# Patient Record
Sex: Female | Born: 1950
Health system: Southern US, Community
[De-identification: ages and names within clinical notes are randomized; demographics above are authoritative.]

## PROBLEM LIST (undated history)

## (undated) DIAGNOSIS — E663 Overweight: Secondary | ICD-10-CM

## (undated) DIAGNOSIS — T8859XA Other complications of anesthesia, initial encounter: Secondary | ICD-10-CM

## (undated) DIAGNOSIS — Z9889 Other specified postprocedural states: Secondary | ICD-10-CM

## (undated) DIAGNOSIS — M8580 Other specified disorders of bone density and structure, unspecified site: Secondary | ICD-10-CM

## (undated) DIAGNOSIS — E559 Vitamin D deficiency, unspecified: Secondary | ICD-10-CM

## (undated) DIAGNOSIS — C8309 Small cell B-cell lymphoma, extranodal and solid organ sites: Secondary | ICD-10-CM

## (undated) DIAGNOSIS — T4145XA Adverse effect of unspecified anesthetic, initial encounter: Secondary | ICD-10-CM

## (undated) DIAGNOSIS — R112 Nausea with vomiting, unspecified: Secondary | ICD-10-CM

## (undated) DIAGNOSIS — R918 Other nonspecific abnormal finding of lung field: Secondary | ICD-10-CM

## (undated) DIAGNOSIS — M4316 Spondylolisthesis, lumbar region: Secondary | ICD-10-CM

## (undated) DIAGNOSIS — C8589 Other specified types of non-Hodgkin lymphoma, extranodal and solid organ sites: Secondary | ICD-10-CM

## (undated) DIAGNOSIS — F419 Anxiety disorder, unspecified: Secondary | ICD-10-CM

## (undated) DIAGNOSIS — E782 Mixed hyperlipidemia: Secondary | ICD-10-CM

## (undated) HISTORY — DX: Other nonspecific abnormal finding of lung field: R91.8

## (undated) HISTORY — DX: Overweight: E66.3

## (undated) HISTORY — DX: Other specified types of non-hodgkin lymphoma, extranodal and solid organ sites: C85.89

## (undated) HISTORY — DX: Other specified disorders of bone density and structure, unspecified site: M85.80

## (undated) HISTORY — DX: Mixed hyperlipidemia: E78.2

## (undated) HISTORY — DX: Anxiety disorder, unspecified: F41.9

## (undated) HISTORY — PX: CRYOTHERAPY: SHX1416

## (undated) HISTORY — PX: WISDOM TOOTH EXTRACTION: SHX21

## (undated) HISTORY — DX: Small cell b-cell lymphoma, extranodal and solid organ sites: C83.09

## (undated) HISTORY — DX: Vitamin D deficiency, unspecified: E55.9

---

## 1992-04-11 HISTORY — PX: BACK SURGERY: SHX140

## 2004-04-11 HISTORY — PX: BREAST BIOPSY: SHX20

## 2009-05-04 ENCOUNTER — Ambulatory Visit (HOSPITAL_COMMUNITY): Admission: RE | Admit: 2009-05-04 | Discharge: 2009-05-04 | Payer: Self-pay | Admitting: Internal Medicine

## 2011-01-11 ENCOUNTER — Other Ambulatory Visit (HOSPITAL_COMMUNITY)
Admission: RE | Admit: 2011-01-11 | Discharge: 2011-01-11 | Disposition: A | Discharge status: Home / Self Care | Payer: Self-pay | Source: Ambulatory Visit | Attending: Family Medicine | Admitting: Family Medicine

## 2011-01-11 DIAGNOSIS — Z124 Encounter for screening for malignant neoplasm of cervix: Secondary | ICD-10-CM | POA: Insufficient documentation

## 2015-02-04 ENCOUNTER — Other Ambulatory Visit (HOSPITAL_COMMUNITY)
Admission: RE | Admit: 2015-02-04 | Discharge: 2015-02-04 | Disposition: A | Discharge status: Home / Self Care | Payer: 59 | Source: Ambulatory Visit | Attending: Family Medicine | Admitting: Family Medicine

## 2015-02-04 DIAGNOSIS — Z1151 Encounter for screening for human papillomavirus (HPV): Secondary | ICD-10-CM | POA: Diagnosis not present

## 2015-02-04 DIAGNOSIS — Z124 Encounter for screening for malignant neoplasm of cervix: Secondary | ICD-10-CM | POA: Insufficient documentation

## 2015-05-01 ENCOUNTER — Encounter: Payer: Self-pay | Admitting: *Deleted

## 2015-05-12 ENCOUNTER — Encounter: Payer: Self-pay | Admitting: Cardiovascular Disease

## 2015-05-12 NOTE — Progress Notes (Signed)
Patient ID: Tracy Rasmussen, female   DOB: June 19, 1950, 65 y.o.   MRN: [id number]     Cardiology Office Note   Date:  05/14/2015   ID:  Tracy Rasmussen, DOB 1951/01/07, MRN [id number]  PCP:  Tawanna Solo, MD  Cardiologist:   Jenkins Rouge, MD   Chief Complaint  Patient presents with  . Establish Care      History of Present Illness: Tracy Rasmussen is a 65 y.o. female who presents for evaluation of cholesterol and triglycerides She has been on medication since age 61.  Most of the interview however she cried and talked about how lonely she is.  Her parents are dead and she is estranged from her sister in [place].  She has lived in [place], [place] and now [place].  Can only find part time work with no benefits.  Affording a healthy low fat diet and non generic meds is hard.  Triglycerides  593 TC 214 HDL 35 Non -HDL 179  Labs from primary 02/04/15.  She is taking zocor 40 mg and fenofibrate 160mg .  Diet is poor sedentary with no exercise. Type 2 diabetes with Aic 6.0.  No thyroid studies recorded.  No clinical history of pancreatitis.  No history of vascular disease    Past Medical History  Diagnosis Date  . Mixed hyperlipidemia   . Anxiety disorder   . Over weight   . Vitamin D deficiency   . Pulmonary nodules   . Small lymphocytic B-cell lymphoma involving skin ([place])   . Osteopenia   . Chronic kidney disease, stage III (moderate)     Past Surgical History  Procedure Laterality Date  . Back surgery  1994    L5, S1 herniated disc  . Cryotherapy  1980's  . Breast biopsy  2006    benign  . Wisdom tooth extraction      teenage years     Current Outpatient Prescriptions  Medication Sig Dispense Refill  . acetaminophen (TYLENOL) 500 MG tablet Take 1,000 mg by mouth at bedtime as needed for mild pain, moderate pain, fever or headache.     . ALPRAZolam (XANAX) 0.25 MG tablet Take 0.25 mg by mouth at bedtime as needed for anxiety.    Marland Kitchen aspirin 81 MG tablet Take 81 mg by  mouth daily.    . cetirizine (ZYRTEC) 10 MG tablet Take 10 mg by mouth daily.    . Cholecalciferol (VITAMIN D) 2000 units CAPS Take 6,000 Units by mouth daily.    . citalopram (CELEXA) 20 MG tablet Take 20 mg by mouth daily.    . clobetasol cream (TEMOVATE) AB-123456789 % Apply 1 application topically 2 (two) times daily.    Marland Kitchen docusate sodium (COLACE) 100 MG capsule Take 200 mg by mouth daily.    . fenofibrate 160 MG tablet Take 160 mg by mouth daily.    . MULTIPLE VITAMIN PO Take 1 tablet by mouth daily.     . Omega-3 Fatty Acids (FISH OIL PO) Take 1 tablet by mouth daily.     Marland Kitchen omeprazole (PRILOSEC) 20 MG capsule Take 20 mg by mouth daily.    . simvastatin (ZOCOR) 40 MG tablet Take 40 mg by mouth daily.     No current facility-administered medications for this visit.    Allergies:   Iodine; Ivp dye; and Melatonin    Social History:  The patient  reports that she has quit smoking. She has quit using smokeless tobacco. She reports that she drinks alcohol. She  reports that she does not use illicit drugs.   Family History:  The patient's family history includes Kidney disease in her father; Pancreatic cancer in her mother; Thyroid disease in her sister. There is no history of Colon cancer, Colon polyps, or Liver disease.    ROS:  Please see the history of present illness.   Otherwise, review of systems are positive for none.   All other systems are reviewed and negative.    PHYSICAL EXAM: VS:  BP 104/62 mmHg  Pulse 61  Ht 5\' 4"  (1.626 m)  Wt 76.658 kg (169 lb)  BMI 28.99 kg/m2  SpO2 95% , BMI Body mass index is 28.99 kg/(m^2). Affect Labile Depressed Teary eyed Healthy:  appears stated age 4: normal Neck supple with no adenopathy JVP normal no bruits no thyromegaly Lungs clear with no wheezing and good diaphragmatic motion Heart:  S1/S2 no murmur, no rub, gallop or click PMI normal Abdomen: benighn, BS positve, no tenderness, no AAA no bruit.  No HSM or HJR Distal pulses intact  with no bruits No edema Neuro non-focal Skin B cell lymphoma on arm  No muscular weakness    EKG:  NSR normal ECG rate 63    Recent Labs: No results found for requested labs within last 365 days.    Lipid Panel No results found for: CHOL, TRIG, HDL, CHOLHDL, VLDL, LDLCALC, LDLDIRECT    Wt Readings from Last 3 Encounters:  05/14/15 76.658 kg (169 lb)      Other studies Reviewed: Additional studies/ records that were reviewed today include: Records from [facility] primary see HPI.    ASSESSMENT AND PLAN:  1.  Lipids:  She is not a candidate for Praluent. She does not have familial hypercholesterolemia, no vascular disease and primary issue is elevated triglycerides. For starters can change to stronger statin that is still generic Lipitor 40 mg.  She is on max recommended dose of fenofibrate.  I suspect that lopid and trilipix would be more expensive.  Lovaza would be less ideal in combination with statin.  Will refer to lipid clinic Discussed with Elberta Leatherwood PhD Suspect it will be ok to add niaspan 1gr but may make her BS worse.  Her A1c is ok  Will check TSH/T4 today.  Clearly the most ideal thing would be for her to eat a healthier diet , lose weight and exercise 2. Depression:  Clearly depressed and feels isolated with no family and inability to find full time work consider starting SSRI 3. Skin Cancer  B cell margin lymphoma f/u derm/oncology    Current medicines are reviewed at length with the patient today.  The patient does not have concerns regarding medicines.  The following changes have been made:  Stop zocor start lipitor 40 mg   Labs/ tests ordered today include: TSH/T4  No orders of the defined types were placed in this encounter.     Disposition:   FU with Lipid clinic       Signed, Jenkins Rouge, MD  05/14/2015 4:32 PM    [facility] [place], [place], [place]  [address] Phone: [phone]; Fax: [phone]

## 2015-05-14 ENCOUNTER — Ambulatory Visit (INDEPENDENT_AMBULATORY_CARE_PROVIDER_SITE_OTHER): Payer: BLUE CROSS/BLUE SHIELD | Admitting: Cardiovascular Disease

## 2015-05-14 ENCOUNTER — Encounter: Payer: Self-pay | Admitting: Cardiovascular Disease

## 2015-05-14 VITALS — BP 104/62 | HR 61 | Ht 64.0 in | Wt 169.0 lb

## 2015-05-14 DIAGNOSIS — E7800 Pure hypercholesterolemia, unspecified: Secondary | ICD-10-CM | POA: Diagnosis not present

## 2015-05-14 DIAGNOSIS — Z7189 Other specified counseling: Secondary | ICD-10-CM | POA: Diagnosis not present

## 2015-05-14 DIAGNOSIS — Z7689 Persons encountering health services in other specified circumstances: Secondary | ICD-10-CM

## 2015-05-14 MED ORDER — FENOFIBRATE 160 MG PO TABS: 160.0000 mg | ORAL_TABLET | Freq: Every day | ORAL | Status: DC

## 2015-05-14 MED ORDER — ATORVASTATIN CALCIUM 40 MG PO TABS: 40.0000 mg | ORAL_TABLET | Freq: Every day | ORAL | Status: DC

## 2015-05-14 NOTE — Patient Instructions (Addendum)
Medication Instructions:  Your physician has recommended you make the following change in your medication:  1-STOP simvastatin 2-START Lipitor 40 mg by mouth daily  Labwork: Your physician recommends that you have lab work today TSH and T4  Testing/Procedures: NONE  Follow-Up: Your physician recommends that you schedule a follow-up appointment with [facility].  If you need a refill on your cardiac medications before your next appointment, please call your pharmacy.

## 2015-05-15 ENCOUNTER — Telehealth: Payer: Self-pay | Admitting: Cardiovascular Disease

## 2015-05-15 LAB — T4, FREE: Free T4: 1.12 ng/dL (ref 0.80–1.80)

## 2015-05-15 LAB — TSH: TSH: 1.918 u[IU]/mL (ref 0.350–4.500)

## 2015-05-15 NOTE — Telephone Encounter (Signed)
F/u  Pt returning Rn phone call- lab results. Please call back and discuss.

## 2015-05-15 NOTE — Telephone Encounter (Signed)
Patient aware of lab results.

## 2015-05-29 ENCOUNTER — Ambulatory Visit (INDEPENDENT_AMBULATORY_CARE_PROVIDER_SITE_OTHER): Payer: BLUE CROSS/BLUE SHIELD | Admitting: Pharmacist

## 2015-05-29 DIAGNOSIS — E782 Mixed hyperlipidemia: Secondary | ICD-10-CM | POA: Diagnosis not present

## 2015-05-29 NOTE — Progress Notes (Signed)
HPI: Tracy Rasmussen is a 65 y.o. female patient referred to lipid clinic by Dr. Johnsie Cancel. She has a history of hypertriglyceridemia and has been on cholesterol medications since she was 17.  She states she has been on statins for years but just recently added the fenofibrate.  Her mother also had elevated TG but no family history of CAD.  Her other PMH is non-contributory.  She is currently taking simvastatin 40 mg, fenofibrate 160 mg, and OTC fish oil 1 gm per day. She states she was supposed to switch to Lipitor 40 mg but has not done so because she still has simvastatin left. She denies any issues with the medications. She does not remember trying any other statins.    Current Medications: Simvastatin 40 mg, fenofibrate 160 mg, fish oil  Intolerances: none Risk Factors: age, family history of hypertriglyceridemia LDL goal: <130, non-HDL <160  Diet: Patient was non-descriptive with her diet but states she knows her diet is not good and she is going to focus on cutting back on the unhealthy choices. Only drinks coffee, water and tea with no added sugar.   Exercise: None  Family History: The patient's family history includes Kidney disease in her father; Pancreatic cancer and high triglycerides in her mother; Thyroid disease in her sister. There is no history of Colon cancer, Colon polyps, or Liver disease.   Social History: The patient  reports that she has quit smoking. . She reports that she drinks alcohol but only socially. She reports that she does not use illicit drugs.   Labs:  02/04/2015 (from PCP): TC 214, TG 593, HDL 35, Non-HDL 179  Past Medical History  Diagnosis Date  . Mixed hyperlipidemia   . Anxiety disorder   . Over weight   . Vitamin D deficiency   . Pulmonary nodules   . Small lymphocytic B-cell lymphoma involving skin ([place])   . Osteopenia   . Chronic kidney disease, stage III (moderate)     Current Outpatient Prescriptions on File Prior to Visit  Medication  Sig Dispense Refill  . acetaminophen (TYLENOL) 500 MG tablet Take 1,000 mg by mouth at bedtime as needed for mild pain, moderate pain, fever or headache.     . ALPRAZolam (XANAX) 0.25 MG tablet Take 0.25 mg by mouth at bedtime as needed for anxiety.    Marland Kitchen aspirin 81 MG tablet Take 81 mg by mouth daily.    Marland Kitchen atorvastatin (LIPITOR) 40 MG tablet Take 1 tablet (40 mg total) by mouth daily. 90 tablet 3  . cetirizine (ZYRTEC) 10 MG tablet Take 10 mg by mouth daily.    . Cholecalciferol (VITAMIN D) 2000 units CAPS Take 6,000 Units by mouth daily.    . citalopram (CELEXA) 20 MG tablet Take 20 mg by mouth daily.    . clobetasol cream (TEMOVATE) AB-123456789 % Apply 1 application topically 2 (two) times daily.    Marland Kitchen docusate sodium (COLACE) 100 MG capsule Take 200 mg by mouth daily.    . fenofibrate 160 MG tablet Take 1 tablet (160 mg total) by mouth daily. 30 tablet 11  . MULTIPLE VITAMIN PO Take 1 tablet by mouth daily.     . Omega-3 Fatty Acids (FISH OIL PO) Take 1 tablet by mouth daily.     Marland Kitchen omeprazole (PRILOSEC) 20 MG capsule Take 20 mg by mouth daily.     No current facility-administered medications on file prior to visit.    Allergies  Allergen Reactions  . Iodine  Hives  . Ivp Dye [Iodinated Diagnostic Agents] Hives  . Melatonin Other (See Comments)    Dry mouth    Assessment/Plan: 1. Hyperlipidemia - Patients labs from 02/04/15 show elevated triglycerides at 593. She states they have always been elevated since she was 17. She is currently taking simvastatin 40 mg, OTC fish oil, and fenofibrate 160 mg. She states she takes the fenofibrate after breakfast. Dr. Johnsie Cancel had already changed her simvastatin to Lipitor.  Advised patient to start taking Lipitor 40 mg now instead of waiting to finish the simvastatin. Also will increase the fish oil to 4 capsules per day.  Pt is aware that most of her issue is related to diet and is willing to make change.  Will get follow up lipid panel in 2 months.

## 2015-05-29 NOTE — Patient Instructions (Signed)
Change your simvastatin to Lipitor (atorvastatin)  Increase your fish oil 4 capsules per day.  You can take 2 capsules twice a day.   Try to limit your cheese, carbohydrates, and sugars.   We will recheck your labs in 2 months.   If you have any problems, please call Gay Filler at [phone]

## 2015-08-25 ENCOUNTER — Other Ambulatory Visit (INDEPENDENT_AMBULATORY_CARE_PROVIDER_SITE_OTHER): Payer: BLUE CROSS/BLUE SHIELD | Admitting: *Deleted

## 2015-08-25 DIAGNOSIS — E782 Mixed hyperlipidemia: Secondary | ICD-10-CM

## 2015-08-25 LAB — LIPID PANEL
Cholesterol: 156 mg/dL (ref 125–200)
HDL: 30 mg/dL — AB (ref >=46)
LDL CALC: 61 mg/dL (ref <130)
TRIGLYCERIDES: 324 mg/dL — AB (ref <150)
Total CHOL/HDL Ratio: 5.2 Ratio — ABNORMAL HIGH (ref <=5.0)
VLDL: 65 mg/dL — AB (ref <30)

## 2015-08-25 LAB — HEPATIC FUNCTION PANEL
ALBUMIN: 4.1 g/dL (ref 3.6–5.1)
ALT: 30 U/L — ABNORMAL HIGH (ref 6–29)
AST: 24 U/L (ref 10–35)
Alkaline Phosphatase: 49 U/L (ref 33–130)
BILIRUBIN TOTAL: 0.4 mg/dL (ref 0.2–1.2)
Bilirubin, Direct: 0.1 mg/dL (ref <=0.2)
Indirect Bilirubin: 0.3 mg/dL (ref 0.2–1.2)
TOTAL PROTEIN: 6.6 g/dL (ref 6.1–8.1)

## 2015-08-25 LAB — LDL CHOLESTEROL, DIRECT: Direct LDL: 86 mg/dL (ref <130)

## 2015-08-25 NOTE — Addendum Note (Signed)
Addended by: Eulis Foster on: 08/25/2015 07:46 AM   Modules accepted: Orders

## 2015-08-28 ENCOUNTER — Ambulatory Visit (INDEPENDENT_AMBULATORY_CARE_PROVIDER_SITE_OTHER): Payer: BLUE CROSS/BLUE SHIELD | Admitting: Pharmacist

## 2015-08-28 DIAGNOSIS — E782 Mixed hyperlipidemia: Secondary | ICD-10-CM

## 2015-08-28 NOTE — Patient Instructions (Addendum)
Stop Lipitor for 1 week to see if your leg pain gets better.  Please call Gay Filler at [phone] next week to let us know how the pain is doing.   Continue your other medications.

## 2015-08-28 NOTE — Progress Notes (Signed)
HPI: Tracy Rasmussen is a 65 y.o. female patient referred to lipid clinic by Dr. Johnsie Cancel. She has a history of hypertriglyceridemia and has been on cholesterol medications since she was 17.  She states she has been on statins for years but just recently added the fenofibrate.  Her mother also had elevated TG but no family history of CAD.  Her other PMH is non-contributory.  She is currently taking Lipitor 40 mg, fenofibrate 160 mg, and OTC fish oil 4 gm per day.   Pt comes today for follow up.  She has been taking Lipitor.  She states she is having some nonspecific pain in her R leg and unsure if it is medication related or not.  She has a history of herniated disc which was surgically repaired and thinks it may be related to this.  The pain does not limit her activities but she is aware it is there.   Current Medications: Lipitor 40 mg, fenofibrate 160 mg, fish oil 4 gm Intolerances: simvastatin 40mg  (ineffective) Risk Factors: age, family history of hypertriglyceridemia LDL goal: <130, non-HDL <160  Diet: Patient has tried to make improvements to her diet.  She has increased her fresh fruits and vegetables and decreased her sweets.   Exercise: None  Family History: The patient's family history includes Kidney disease in her father; Pancreatic cancer and high triglycerides in her mother; Thyroid disease in her sister. There is no history of Colon cancer, Colon polyps, or Liver disease.   Social History: The patient  reports that she has quit smoking. . She reports that she drinks alcohol but only socially. She reports that she does not use illicit drugs.   Labs:  08/25/2015: TC 156, TG 324, HDL 30, LDL 61, non-HDL- 126 (Lipitor 40mg , fenofibrate 160mg , fish oil 4 gm) 02/04/2015 (from PCP): TC 214, TG 593, HDL 35, Non-HDL 179  Past Medical History  Diagnosis Date  . Mixed hyperlipidemia   . Anxiety disorder   . Over weight   . Vitamin D deficiency   . Pulmonary nodules   . Small  lymphocytic B-cell lymphoma involving skin ([place])   . Osteopenia   . Chronic kidney disease, stage III (moderate)     Current Outpatient Prescriptions on File Prior to Visit  Medication Sig Dispense Refill  . acetaminophen (TYLENOL) 500 MG tablet Take 1,000 mg by mouth at bedtime as needed for mild pain, moderate pain, fever or headache.     . ALPRAZolam (XANAX) 0.25 MG tablet Take 0.25 mg by mouth at bedtime as needed for anxiety.    Marland Kitchen aspirin 81 MG tablet Take 81 mg by mouth daily.    Marland Kitchen atorvastatin (LIPITOR) 40 MG tablet Take 1 tablet (40 mg total) by mouth daily. 90 tablet 3  . cetirizine (ZYRTEC) 10 MG tablet Take 10 mg by mouth daily.    . Cholecalciferol (VITAMIN D) 2000 units CAPS Take 6,000 Units by mouth daily.    . citalopram (CELEXA) 20 MG tablet Take 20 mg by mouth daily.    . clobetasol cream (TEMOVATE) AB-123456789 % Apply 1 application topically 2 (two) times daily.    Marland Kitchen docusate sodium (COLACE) 100 MG capsule Take 200 mg by mouth daily.    . fenofibrate 160 MG tablet Take 1 tablet (160 mg total) by mouth daily. 30 tablet 11  . MULTIPLE VITAMIN PO Take 1 tablet by mouth daily.     . Omega-3 Fatty Acids (FISH OIL PO) Take 1 tablet by mouth daily.     Marland Kitchen  omeprazole (PRILOSEC) 20 MG capsule Take 20 mg by mouth daily.     No current facility-administered medications on file prior to visit.    Allergies  Allergen Reactions  . Iodine Hives  . Ivp Dye [Iodinated Diagnostic Agents] Hives  . Melatonin Other (See Comments)    Dry mouth    Assessment/Plan: 1. Hyperlipidemia - Patients TG improved significantly since last visit.  She has some concerns that myalgias are related to Lipitor.  Plan to have her hold Lipitor x 1 week.  If pain resolves, will consider changing to Crestor.  If pain is no different, continue Lipitor and have her follow up with her orthopedic MD.  Pt is agreeable to this plan.  Will schedule follow up depending on her response to medication holiday next week.

## 2015-09-04 ENCOUNTER — Telehealth: Payer: Self-pay | Admitting: Pharmacist

## 2015-09-04 DIAGNOSIS — E782 Mixed hyperlipidemia: Secondary | ICD-10-CM

## 2015-09-04 NOTE — Telephone Encounter (Signed)
Pt called to follow up with being off the Lipitor for 1 week.  She states her pain is no better; maybe even slightly worse.  She does not think it is medication related and has an appt with her orthopedic MD on 6/12.  She will restart Lipitor now.  Plan to recheck labs in 6 months.  She will call with any updates in the interim.

## 2015-12-29 ENCOUNTER — Other Ambulatory Visit: Payer: Self-pay | Admitting: Orthopaedic Surgery

## 2015-12-29 DIAGNOSIS — M545 Low back pain: Secondary | ICD-10-CM

## 2015-12-29 DIAGNOSIS — M25551 Pain in right hip: Secondary | ICD-10-CM

## 2016-01-06 ENCOUNTER — Ambulatory Visit
Admission: RE | Admit: 2016-01-06 | Discharge: 2016-01-06 | Disposition: A | Discharge status: Home / Self Care | Payer: BLUE CROSS/BLUE SHIELD | Source: Ambulatory Visit | Attending: Orthopaedic Surgery | Admitting: Orthopaedic Surgery

## 2016-01-06 ENCOUNTER — Encounter: Payer: Self-pay | Admitting: Radiology

## 2016-01-06 DIAGNOSIS — M545 Low back pain: Secondary | ICD-10-CM

## 2016-01-06 DIAGNOSIS — M25551 Pain in right hip: Secondary | ICD-10-CM

## 2016-02-15 ENCOUNTER — Other Ambulatory Visit: Payer: BLUE CROSS/BLUE SHIELD | Admitting: *Deleted

## 2016-02-15 DIAGNOSIS — E782 Mixed hyperlipidemia: Secondary | ICD-10-CM

## 2016-02-15 LAB — HEPATIC FUNCTION PANEL
ALT: 24 U/L (ref 6–29)
AST: 26 U/L (ref 10–35)
Albumin: 4.2 g/dL (ref 3.6–5.1)
Alkaline Phosphatase: 53 U/L (ref 33–130)
BILIRUBIN DIRECT: 0.1 mg/dL (ref <=0.2)
BILIRUBIN INDIRECT: 0.3 mg/dL (ref 0.2–1.2)
BILIRUBIN TOTAL: 0.4 mg/dL (ref 0.2–1.2)
Total Protein: 6.8 g/dL (ref 6.1–8.1)

## 2016-02-15 LAB — LIPID PANEL
CHOL/HDL RATIO: 5.5 ratio — AB (ref <5.0)
CHOLESTEROL: 176 mg/dL (ref <200)
HDL: 32 mg/dL — ABNORMAL LOW (ref >50)
Triglycerides: 417 mg/dL — ABNORMAL HIGH (ref <150)

## 2016-02-16 ENCOUNTER — Other Ambulatory Visit: Payer: Self-pay | Admitting: Pharmacist

## 2016-02-16 DIAGNOSIS — E785 Hyperlipidemia, unspecified: Secondary | ICD-10-CM

## 2016-05-09 ENCOUNTER — Other Ambulatory Visit: Payer: Self-pay | Admitting: Cardiovascular Disease

## 2016-05-20 ENCOUNTER — Other Ambulatory Visit: Payer: Self-pay | Admitting: *Deleted

## 2016-05-20 MED ORDER — FENOFIBRATE 160 MG PO TABS: 160.0000 mg | ORAL_TABLET | Freq: Every day | ORAL | 0 refills | Status: DC

## 2016-05-20 MED ORDER — ATORVASTATIN CALCIUM 40 MG PO TABS: 40.0000 mg | ORAL_TABLET | Freq: Every day | ORAL | 0 refills | Status: DC

## 2016-05-20 NOTE — Telephone Encounter (Signed)
fenofibrate 160 MG tablet  Medication  Date: 05/11/2016 Department: [address] Office Ordering/Authorizing: Josue Hector, MD  Order Providers   Prescribing Provider Encounter Provider  Josue Hector, MD Josue Hector, MD  Medication Detail    Disp Refills Start End   fenofibrate 160 MG tablet 30 tablet 0 05/11/2016    Sig - Route: Take 1 tablet (160 mg total) by mouth daily. *Please call and schedule an appointment for further refills* - Oral   E-Prescribing Status: Receipt confirmed by pharmacy (05/11/2016 11:29 AM EST)   Pharmacy   [facility], [address]   atorvastatin (LIPITOR) 40 MG tablet  Medication  Date: 05/11/2016 Department: [facility] [address] Office Ordering/Authorizing: Josue Hector, MD  Order Providers   Prescribing Provider Encounter Provider  Josue Hector, MD Josue Hector, MD  Medication Detail    Disp Refills Start End   atorvastatin (LIPITOR) 40 MG tablet 30 tablet 0 05/11/2016    Sig - Route: Take 1 tablet (40 mg total) by mouth daily. *Please call and schedule an appointment for further refills* - Oral   E-Prescribing Status: Receipt confirmed by pharmacy (05/11/2016 11:29 AM EST)   Pharmacy   [facility] [address], [place]

## 2016-06-06 NOTE — Progress Notes (Signed)
CARDIOLOGY OFFICE NOTE  Date:  06/07/2016    [place] Date of Birth: Sep 09, 1950 Medical Record [id number]  PCP:  Tawanna Solo, MD  Cardiologist:  Johnsie Cancel   Chief Complaint  Patient presents with  . Hyperlipidemia    1 year check - seen for Dr. Johnsie Cancel    History of Present Illness: Tracy Rasmussen is a 66 y.o. female who presents today for a follow up visit. Seen for Dr. Johnsie Cancel.   She has had long standing HLD - she has been on medication since age 74.    Last seen last February of 2017 by Dr. Johnsie Cancel - noted that most of the interview however she cried and talked about how lonely she was.  Her parents are dead and she is estranged from her sister in [place].  She has lived in [place], [place] and now [place].  Could not find part time work with no benefits.  Affording a healthy low fat diet and non generic meds is hard.  She has been seen in the lipid clinic here as well.    Comes in today. Here alone. She is now limited by back pain - has a herniated disc - on Tramadol and gabapentin. Not exercising. No chest pain. Not short of breath. She has gotten a job - off of [employer] now. Needs meds refilled. She feels like she is doing ok other than back pain.   Past Medical History:  Diagnosis Date  . Anxiety disorder   . Chronic kidney disease, stage III (moderate)   . Mixed hyperlipidemia   . Osteopenia   . Over weight   . Pulmonary nodules   . Small lymphocytic B-cell lymphoma involving skin ([place])   . Vitamin D deficiency     Past Surgical History:  Procedure Laterality Date  . BACK SURGERY  1994   L5, S1 herniated disc  . BREAST BIOPSY  2006   benign  . CRYOTHERAPY  1980's  . WISDOM TOOTH EXTRACTION     teenage years     Medications: Current Outpatient Prescriptions  Medication Sig Dispense Refill  . ALPRAZolam (XANAX) 0.25 MG tablet Take 0.25 mg by mouth at bedtime as needed for anxiety.    Marland Kitchen aspirin 81 MG tablet Take 81 mg by mouth daily.    Marland Kitchen  atorvastatin (LIPITOR) 40 MG tablet Take 1 tablet (40 mg total) by mouth daily. 90 tablet 3  . cetirizine (ZYRTEC) 10 MG tablet Take 10 mg by mouth daily.    . Cholecalciferol (VITAMIN D) 2000 units CAPS Take 6,000 Units by mouth daily.    . citalopram (CELEXA) 20 MG tablet Take 20 mg by mouth daily.    . clobetasol cream (TEMOVATE) AB-123456789 % Apply 1 application topically 2 (two) times daily.    Marland Kitchen docusate sodium (COLACE) 100 MG capsule Take 200 mg by mouth daily.    . fenofibrate 160 MG tablet Take 1 tablet (160 mg total) by mouth daily. 90 tablet 3  . MULTIPLE VITAMIN PO Take 1 tablet by mouth daily.     . Omega-3 Fatty Acids (FISH OIL PO) Take 4 tablets by mouth daily.    Marland Kitchen omeprazole (PRILOSEC) 20 MG capsule Take 20 mg by mouth daily.    Marland Kitchen acetaminophen (TYLENOL) 500 MG tablet Take 1,000 mg by mouth at bedtime as needed for mild pain, moderate pain, fever or headache.      No current facility-administered medications for this visit.     Allergies: Allergies  Allergen Reactions  . Iodine Hives  . Ivp Dye [Iodinated Diagnostic Agents] Hives  . Melatonin Other (See Comments)    Dry mouth    Social History: The patient  reports that she has quit smoking. She has quit using smokeless tobacco. She reports that she drinks alcohol. She reports that she does not use drugs.   Family History: The patient's family history includes Kidney disease in her father; Pancreatic cancer in her mother; Thyroid disease in her sister.   Review of Systems: Please see the history of present illness.   Otherwise, the review of systems is positive for none.   All other systems are reviewed and negative.   Physical Exam: VS:  BP (!) 150/100   Pulse (!) 59   Ht 5\' 3"  (1.6 m)   Wt 164 lb 12.8 oz (74.8 kg)   BMI 29.19 kg/m  .  BMI Body mass index is 29.19 kg/m.  Wt Readings from Last 3 Encounters:  06/07/16 164 lb 12.8 oz (74.8 kg)  05/14/15 169 lb (76.7 kg)   BP is 130/90 by me in the right arm.    General: Pleasant.  She is alert and in no acute distress.   HEENT: Normal.  Neck: Supple, no JVD, carotid bruits, or masses noted.  Cardiac: Regular rate and rhythm. No murmurs, rubs, or gallops. No edema.  Respiratory:  Lungs are clear to auscultation bilaterally with normal work of breathing.  GI: Soft and nontender.  MS: No deformity or atrophy. Gait and ROM intact.  Skin: Warm and dry. Color is normal.  Neuro:  Strength and sensation are intact and no gross focal deficits noted.  Psych: Alert, appropriate and with normal affect.   LABORATORY DATA:  EKG:  EKG is ordered today. This demonstrates NSR.  Lab Results  Component Value Date   CHOL 176 02/15/2016   TRIG 417 (H) 02/15/2016   HDL 32 (L) 02/15/2016   LDLDIRECT 86 08/25/2015   LDLCALC NOT CALC 02/15/2016   ALT 24 02/15/2016   AST 26 02/15/2016   TSH 1.918 05/14/2015    BNP (last 3 results) No results for input(s): BNP in the last 8760 hours.  ProBNP (last 3 results) No results for input(s): PROBNP in the last 8760 hours.   Other Studies Reviewed Today:   Assessment/Plan: 1. HLD - needs labs today - meds refilled - consider referral back to the lipid clinic if needed. Continue with CV risk factor modification - her weight is down over the past year.   2. Elevated BP - I have asked her to monitor - goal is 130/80 or less.   3. Back pain - may be looking at surgery - mobility encouraged.   Current medicines are reviewed with the patient today.  The patient does not have concerns regarding medicines other than what has been noted above.  The following changes have been made:  See above.  Labs/ tests ordered today include:    Orders Placed This Encounter  Procedures  . Lipid panel  . Hepatic function panel  . Basic metabolic panel  . EKG 12-Lead     Disposition:   FU with Dr. Johnsie Cancel in one year.   Patient is agreeable to this plan and will call if any problems develop in the interim.    SignedTruitt Merle, NP  06/07/2016 8:30 AM  [facility] [address] [place], [place]  [address] Phone: [phone] Fax: [phone]

## 2016-06-07 ENCOUNTER — Ambulatory Visit (INDEPENDENT_AMBULATORY_CARE_PROVIDER_SITE_OTHER): Payer: 59 | Admitting: Nurse Practitioner

## 2016-06-07 ENCOUNTER — Encounter: Payer: Self-pay | Admitting: Nurse Practitioner

## 2016-06-07 ENCOUNTER — Encounter (INDEPENDENT_AMBULATORY_CARE_PROVIDER_SITE_OTHER): Payer: Self-pay

## 2016-06-07 VITALS — BP 150/100 | HR 59 | Ht 63.0 in | Wt 164.8 lb

## 2016-06-07 DIAGNOSIS — E782 Mixed hyperlipidemia: Secondary | ICD-10-CM | POA: Diagnosis not present

## 2016-06-07 LAB — HEPATIC FUNCTION PANEL
ALT: 27 IU/L (ref 0–32)
AST: 27 IU/L (ref 0–40)
Albumin: 4.6 g/dL (ref 3.6–4.8)
Alkaline Phosphatase: 69 IU/L (ref 39–117)
Bilirubin Total: 0.3 mg/dL (ref 0.0–1.2)
Bilirubin, Direct: 0.1 mg/dL (ref 0.00–0.40)
Total Protein: 7.3 g/dL (ref 6.0–8.5)

## 2016-06-07 LAB — BASIC METABOLIC PANEL
BUN/Creatinine Ratio: 25 (ref 12–28)
BUN: 23 mg/dL (ref 8–27)
CO2: 23 mmol/L (ref 18–29)
Calcium: 9.9 mg/dL (ref 8.7–10.3)
Chloride: 99 mmol/L (ref 96–106)
Creatinine, Ser: 0.91 mg/dL (ref 0.57–1.00)
GFR calc Af Amer: 77 mL/min/{1.73_m2} (ref >59)
GFR calc non Af Amer: 67 mL/min/{1.73_m2} (ref >59)
Glucose: 96 mg/dL (ref 65–99)
Potassium: 4.8 mmol/L (ref 3.5–5.2)
Sodium: 140 mmol/L (ref 134–144)

## 2016-06-07 LAB — LIPID PANEL
Chol/HDL Ratio: 5.6 ratio units — ABNORMAL HIGH (ref 0.0–4.4)
Cholesterol, Total: 178 mg/dL (ref 100–199)
HDL: 32 mg/dL — ABNORMAL LOW (ref >39)
LDL Calculated: 70 mg/dL (ref 0–99)
Triglycerides: 380 mg/dL — ABNORMAL HIGH (ref 0–149)
VLDL Cholesterol Cal: 76 mg/dL — ABNORMAL HIGH (ref 5–40)

## 2016-06-07 MED ORDER — FENOFIBRATE 160 MG PO TABS: 160.0000 mg | ORAL_TABLET | Freq: Every day | ORAL | 3 refills | Status: DC

## 2016-06-07 MED ORDER — ATORVASTATIN CALCIUM 40 MG PO TABS: 40.0000 mg | ORAL_TABLET | Freq: Every day | ORAL | 3 refills | Status: DC

## 2016-06-07 NOTE — Patient Instructions (Addendum)
We will be checking the following labs today - BMET, HPF and Lipids   Medication Instructions:    Continue with your current medicines.   I sent in your refills today    Testing/Procedures To Be Arranged:  N/A  Follow-Up:   See Dr. Johnsie Cancel in one year. Based on what your labs show, we may send you back to the lipid clinic once I review your labs.     Other Special Instructions:   Try to monitor your blood pressure for [place] - keep a diary - goal is less than 130/80 most of the time.     If you need a refill on your cardiac medications before your next appointment, please call your pharmacy.   Call the [place] office at [phone] if you have any questions, problems or concerns.

## 2016-06-13 ENCOUNTER — Telehealth: Payer: Self-pay | Admitting: Cardiovascular Disease

## 2016-06-13 NOTE — Telephone Encounter (Signed)
Patient is calling because at her last visit with Truitt Merle her Triglycerides were found to be high. Patient states that they are always high, because her mother was the same way. Truitt Merle suggested that she start a trial medication, patient states that she would like to make an appt to speak with pharmacist and make it for May 2nd, instead of having blood checked, she would like a consultation with pharmacist. Please call to discuss, thanks.

## 2016-06-14 NOTE — Telephone Encounter (Signed)
Spoke with pt and discussed recommendations per Cecille Rubin to increase Lipitor to 80mg  daily. However, pt does not want to increase her statin dose at this time. She prefers to come in for a pharmacy clinic visit instead of having her labs checked. She prefers to keep this visit in May rather than coming in sooner. Appt scheduled for lipid visit in May.

## 2016-06-14 NOTE — Telephone Encounter (Signed)
LMOM for pt to return call to schedule pharmacy appt.

## 2016-08-10 ENCOUNTER — Ambulatory Visit: Payer: 59

## 2016-08-10 ENCOUNTER — Other Ambulatory Visit: Payer: Self-pay

## 2016-08-11 ENCOUNTER — Ambulatory Visit (INDEPENDENT_AMBULATORY_CARE_PROVIDER_SITE_OTHER): Payer: 59 | Admitting: Pharmacist

## 2016-08-11 DIAGNOSIS — E782 Mixed hyperlipidemia: Secondary | ICD-10-CM

## 2016-08-11 DIAGNOSIS — E785 Hyperlipidemia, unspecified: Secondary | ICD-10-CM | POA: Diagnosis not present

## 2016-08-11 MED ORDER — ATORVASTATIN CALCIUM 80 MG PO TABS: 80.0000 mg | ORAL_TABLET | Freq: Every day | ORAL | 3 refills | Status: DC

## 2016-08-11 NOTE — Patient Instructions (Addendum)
Increase your Lipitor to 80mg  daily  Continue taking your fish oil 4g daily and fenofibrate 160mg  daily  Try to exercise more after your back surgery  Try to cut back on carbohydrates  Recheck cholesterol in 4 months on Monday September 10th at 7:30am - fasting labs.

## 2016-08-11 NOTE — Progress Notes (Addendum)
Patient ID: Tracy Rasmussen                 DOB: July 18, 1950                    MRN: [id number]     HPI: Tracy Rasmussen is a 66 y.o. female patient referred to lipid clinic by Dr. Johnsie Cancel who presents today for follow up. She has a history of hypertriglyceridemia and has been on cholesterol medications since she was 17. Her mother also had elevated TG but no family history of CAD. TG were previously elevated when pt saw Truitt Merle on 06/07/16. Pt was advised to increase her Lipitor to 80mg  daily however she did not want to increase her dose or have her labs checked until f/u with lipid clinic. She presents today for management.   Pt states that she is having back surgery on 5/31 for a herniated disc. She has had a lot of pain and has not been able to walk or exercise because of this. Today is her 66th birthday and she is "happy she made it to 79." Reports adherence with her fish oil, fenofibrate, and Lipitor. She is taking her fenofibrate with food. Admits to a few dietary indiscretions - mainly likes carbohydrates but has worked to cut back on sugar intake. States her TG will always be high because it runs in her family.  Current Medications: Lipitor 40mg  daily, fenofibrate 160mg  daily, fish oil 4g daily Intolerances: simvastatin 40mg  (ineffective) Risk Factors: age, family history of hypertriglyceridemia LDL goal: <130, non-HDL <160, TG goal < 150  Diet: Patient has tried to make improvements to her diet.  She has increased her fresh fruits, fish, salad, and vegetables. She does like carbohydrates. Social alcohol use. Drinks unsweet tea and has cut back on sugar intake.  Exercise: None - upcoming herniated disc procedure.  Family History: The patient's family history includes Kidney disease in her father; Pancreatic cancer and high triglycerides in her mother; Thyroid disease in her sister. There is no history of Colon cancer, Colon polyps, or Liver disease.   Social History: The patient   reports that she has quit smoking. . She reports that she drinks alcohol but only socially. She reports that she does not use illicit drugs.   Labs:  06/07/2016: TC 178, TG 380, HDL 32, LDL 70 (Lipitor 40mg  daily, fenofibrate 160mg  daily, fish oil 4g daily) 08/25/2015: TC 156, TG 324, HDL 30, LDL 61, non-HDL- 126 (Lipitor 40mg , fenofibrate 160mg , fish oil 4 gm) 02/04/2015 (from PCP): TC 214, TG 593, HDL 35, Non-HDL 179  Past Medical History:  Diagnosis Date  . Anxiety disorder   . Chronic kidney disease, stage III (moderate)   . Mixed hyperlipidemia   . Osteopenia   . Over weight   . Pulmonary nodules   . Small lymphocytic B-cell lymphoma involving skin ([place])   . Vitamin D deficiency     Current Outpatient Prescriptions on File Prior to Visit  Medication Sig Dispense Refill  . acetaminophen (TYLENOL) 500 MG tablet Take 1,000 mg by mouth at bedtime as needed for mild pain, moderate pain, fever or headache.     . ALPRAZolam (XANAX) 0.25 MG tablet Take 0.25 mg by mouth at bedtime as needed for anxiety.    Marland Kitchen aspirin 81 MG tablet Take 81 mg by mouth daily.    Marland Kitchen atorvastatin (LIPITOR) 40 MG tablet Take 1 tablet (40 mg total) by mouth daily. 90 tablet 3  . cetirizine (ZYRTEC)  10 MG tablet Take 10 mg by mouth daily.    . Cholecalciferol (VITAMIN D) 2000 units CAPS Take 6,000 Units by mouth daily.    . citalopram (CELEXA) 20 MG tablet Take 20 mg by mouth daily.    . clobetasol cream (TEMOVATE) 1.94 % Apply 1 application topically 2 (two) times daily.    Marland Kitchen docusate sodium (COLACE) 100 MG capsule Take 200 mg by mouth daily.    . fenofibrate 160 MG tablet Take 1 tablet (160 mg total) by mouth daily. 90 tablet 3  . MULTIPLE VITAMIN PO Take 1 tablet by mouth daily.     . Omega-3 Fatty Acids (FISH OIL PO) Take 4 tablets by mouth daily.    Marland Kitchen omeprazole (PRILOSEC) 20 MG capsule Take 20 mg by mouth daily.     No current facility-administered medications on file prior to visit.     Allergies    Allergen Reactions  . Iodine Hives  . Ivp Dye [Iodinated Diagnostic Agents] Hives  . Melatonin Other (See Comments)    Dry mouth    Assessment/Plan:  1. Hypertriglyceridemia - TG elevated at 380mg /dL above goal 150mg /dL. Pt is adherent to Lipitor 40mg  daily, fish oil 4g daily, and fenofibrate 160mg  daily WF. Will increase Lipitor to 80mg  daily and continue other meds. Pt plans to resume exercising after her upcoming herniated disc procedure on 5/31. She will also work to cut back on carbohydrates. F/u with lipid panel and direct LDL in September.   [name] E. Supple, PharmD, CPP, [place] [address], [place], [place] [address] Phone: [phone]; Fax: [phone] 08/11/2016 3:31 PM

## 2016-08-11 NOTE — Progress Notes (Deleted)
Patient ID: Tracy Rasmussen                 DOB: 1951-02-28                    MRN: [id number]     HPI: Tracy Rasmussen is a 66 y.o. female patient referred to lipid clinic by ***.  Lori NP-C recommended increase of Lipitor to 80mg  daily and patient asked to wait until her lipid visit today.  Current Medications:  Intolerances:  Risk Factors:  LDL goal:   Diet:   Exercise:   Family History:   Social History:   Labs:  Past Medical History:  Diagnosis Date  . Anxiety disorder   . Chronic kidney disease, stage III (moderate)   . Mixed hyperlipidemia   . Osteopenia   . Over weight   . Pulmonary nodules   . Small lymphocytic B-cell lymphoma involving skin ([place])   . Vitamin D deficiency     Current Outpatient Prescriptions on File Prior to Visit  Medication Sig Dispense Refill  . acetaminophen (TYLENOL) 500 MG tablet Take 1,000 mg by mouth at bedtime as needed for mild pain, moderate pain, fever or headache.     . ALPRAZolam (XANAX) 0.25 MG tablet Take 0.25 mg by mouth at bedtime as needed for anxiety.    Marland Kitchen aspirin 81 MG tablet Take 81 mg by mouth daily.    Marland Kitchen atorvastatin (LIPITOR) 40 MG tablet Take 1 tablet (40 mg total) by mouth daily. 90 tablet 3  . cetirizine (ZYRTEC) 10 MG tablet Take 10 mg by mouth daily.    . Cholecalciferol (VITAMIN D) 2000 units CAPS Take 6,000 Units by mouth daily.    . citalopram (CELEXA) 20 MG tablet Take 20 mg by mouth daily.    . clobetasol cream (TEMOVATE) 0.07 % Apply 1 application topically 2 (two) times daily.    Marland Kitchen docusate sodium (COLACE) 100 MG capsule Take 200 mg by mouth daily.    . fenofibrate 160 MG tablet Take 1 tablet (160 mg total) by mouth daily. 90 tablet 3  . MULTIPLE VITAMIN PO Take 1 tablet by mouth daily.     . Omega-3 Fatty Acids (FISH OIL PO) Take 4 tablets by mouth daily.    Marland Kitchen omeprazole (PRILOSEC) 20 MG capsule Take 20 mg by mouth daily.     No current facility-administered medications on file prior to visit.      Allergies  Allergen Reactions  . Iodine Hives  . Ivp Dye [Iodinated Diagnostic Agents] Hives  . Melatonin Other (See Comments)    Dry mouth    Assessment/Plan:

## 2016-12-13 ENCOUNTER — Other Ambulatory Visit: Payer: Self-pay | Admitting: Neurosurgery

## 2016-12-13 DIAGNOSIS — M4310 Spondylolisthesis, site unspecified: Secondary | ICD-10-CM

## 2016-12-19 ENCOUNTER — Other Ambulatory Visit: Payer: 59 | Admitting: *Deleted

## 2016-12-19 DIAGNOSIS — E785 Hyperlipidemia, unspecified: Secondary | ICD-10-CM

## 2016-12-19 LAB — HEPATIC FUNCTION PANEL
ALT: 25 IU/L (ref 0–32)
AST: 30 IU/L (ref 0–40)
Albumin: 4.3 g/dL (ref 3.6–4.8)
Alkaline Phosphatase: 61 IU/L (ref 39–117)
Bilirubin Total: 0.3 mg/dL (ref 0.0–1.2)
Bilirubin, Direct: 0.1 mg/dL (ref 0.00–0.40)
TOTAL PROTEIN: 6.7 g/dL (ref 6.0–8.5)

## 2016-12-19 LAB — LIPID PANEL
CHOL/HDL RATIO: 4.3 ratio (ref 0.0–4.4)
Cholesterol, Total: 133 mg/dL (ref 100–199)
HDL: 31 mg/dL — AB (ref >39)
LDL Calculated: 41 mg/dL (ref 0–99)
Triglycerides: 303 mg/dL — ABNORMAL HIGH (ref 0–149)
VLDL CHOLESTEROL CAL: 61 mg/dL — AB (ref 5–40)

## 2016-12-19 LAB — LDL CHOLESTEROL, DIRECT: LDL DIRECT: 56 mg/dL (ref 0–99)

## 2016-12-19 NOTE — Progress Notes (Signed)
ldl

## 2016-12-20 ENCOUNTER — Telehealth: Payer: Self-pay | Admitting: Pharmacist

## 2016-12-20 NOTE — Telephone Encounter (Signed)
Called pt who states she is adherent to Lipitor 80mg  daily, fenofibrate 160mg  daily, and fish oil 4g daily. LDL is at goal however TG remain elevated. TG have improved from 380 to 303 since increasing Lipitor dose 3 months ago. Medication options are limited to lower TG further, would prefer not to start niacin due to tolerability and LFT issues. Encouraged pt to limit carbs, sugar, and alcohol and to increase physical activity. Exercise has been limited due to back and leg pain. Pt would like to follow up in 1 year for lipids. She will call after January 1st to make an appt.

## 2016-12-24 ENCOUNTER — Other Ambulatory Visit: Payer: 59

## 2016-12-25 ENCOUNTER — Other Ambulatory Visit: Payer: 59

## 2017-01-07 ENCOUNTER — Ambulatory Visit
Admission: RE | Admit: 2017-01-07 | Discharge: 2017-01-07 | Disposition: A | Discharge status: Home / Self Care | Payer: 59 | Source: Ambulatory Visit | Attending: Neurosurgery | Admitting: Neurosurgery

## 2017-01-07 DIAGNOSIS — M4310 Spondylolisthesis, site unspecified: Secondary | ICD-10-CM

## 2017-01-07 MED ORDER — GADOBENATE DIMEGLUMINE 529 MG/ML IV SOLN
15.0000 mL | Freq: Once | INTRAVENOUS | Status: AC | PRN
  Administered 2017-01-07: 15 mL via INTRAVENOUS

## 2017-03-28 ENCOUNTER — Other Ambulatory Visit: Payer: Self-pay | Admitting: Nurse Practitioner

## 2017-04-13 DIAGNOSIS — M5126 Other intervertebral disc displacement, lumbar region: Secondary | ICD-10-CM | POA: Diagnosis not present

## 2017-04-24 DIAGNOSIS — Z510 Encounter for antineoplastic radiation therapy: Secondary | ICD-10-CM | POA: Diagnosis not present

## 2017-04-24 DIAGNOSIS — C8514 Unspecified B-cell lymphoma, lymph nodes of axilla and upper limb: Secondary | ICD-10-CM | POA: Diagnosis not present

## 2017-04-24 DIAGNOSIS — C8519 Unspecified B-cell lymphoma, extranodal and solid organ sites: Secondary | ICD-10-CM | POA: Diagnosis not present

## 2017-04-26 DIAGNOSIS — C8519 Unspecified B-cell lymphoma, extranodal and solid organ sites: Secondary | ICD-10-CM | POA: Diagnosis not present

## 2017-05-09 DIAGNOSIS — C8519 Unspecified B-cell lymphoma, extranodal and solid organ sites: Secondary | ICD-10-CM | POA: Diagnosis not present

## 2017-05-09 DIAGNOSIS — Z510 Encounter for antineoplastic radiation therapy: Secondary | ICD-10-CM | POA: Diagnosis not present

## 2017-05-19 DIAGNOSIS — R030 Elevated blood-pressure reading, without diagnosis of hypertension: Secondary | ICD-10-CM | POA: Diagnosis not present

## 2017-05-19 DIAGNOSIS — Z6829 Body mass index (BMI) 29.0-29.9, adult: Secondary | ICD-10-CM | POA: Diagnosis not present

## 2017-05-19 DIAGNOSIS — M5126 Other intervertebral disc displacement, lumbar region: Secondary | ICD-10-CM | POA: Diagnosis not present

## 2017-05-26 ENCOUNTER — Telehealth: Payer: Self-pay | Admitting: Cardiovascular Disease

## 2017-05-26 NOTE — Telephone Encounter (Signed)
Called patient back about her message. Informed patient that lab work is not due until September. Patient was calling per recall to schedule year f/u with Dr. Johnsie Cancel. Patient will see Dr. Johnsie Cancel 06/14/17. Patient verbalized understanding.

## 2017-05-26 NOTE — Telephone Encounter (Signed)
Tracy Rasmussen is returning your call. Thanks

## 2017-05-26 NOTE — Telephone Encounter (Signed)
Left message for patient to call back  

## 2017-05-26 NOTE — Telephone Encounter (Signed)
New message   Pt wants to know if she needs labs because she wants it scheduled on the same day as her follow up, No updated order in epic. Please call

## 2017-06-05 DIAGNOSIS — M8580 Other specified disorders of bone density and structure, unspecified site: Secondary | ICD-10-CM | POA: Diagnosis not present

## 2017-06-05 DIAGNOSIS — Z79899 Other long term (current) drug therapy: Secondary | ICD-10-CM | POA: Diagnosis not present

## 2017-06-05 DIAGNOSIS — E782 Mixed hyperlipidemia: Secondary | ICD-10-CM | POA: Diagnosis not present

## 2017-06-05 DIAGNOSIS — R739 Hyperglycemia, unspecified: Secondary | ICD-10-CM | POA: Diagnosis not present

## 2017-06-05 DIAGNOSIS — Z0000 Encounter for general adult medical examination without abnormal findings: Secondary | ICD-10-CM | POA: Diagnosis not present

## 2017-06-05 DIAGNOSIS — F419 Anxiety disorder, unspecified: Secondary | ICD-10-CM | POA: Diagnosis not present

## 2017-06-05 DIAGNOSIS — E663 Overweight: Secondary | ICD-10-CM | POA: Diagnosis not present

## 2017-06-05 DIAGNOSIS — G4700 Insomnia, unspecified: Secondary | ICD-10-CM | POA: Diagnosis not present

## 2017-06-09 NOTE — Progress Notes (Signed)
CARDIOLOGY OFFICE NOTE  Date:  06/14/2017    [place] Setting Date of Birth: 01-10-1951 Medical Record #[id number]  PCP:  Kathyrn Lass, MD  Cardiologist:  Johnsie Cancel   No chief complaint on file.   History of Present Illness:  67 y.o. I have not seen since 2017 Seen by PA last year. Lipids main issue on Rx since age 4 Seen in lipid clinic and TG elevated but not started on niacin due to multiple meds and LFT Concerns. Advised to decrease ETOH, carbs and more exercise Depressed and lonely Parents Deceased and estranged from sister in [place]. Activity limited by back pain. Takes Tramadol and gabapentin for pain  No cardiac symptoms Discussed utility of calcium score to risk stratify   Past Medical History:  Diagnosis Date  . Anxiety disorder   . Chronic kidney disease, stage III (moderate) ([facility])   . Mixed hyperlipidemia   . Osteopenia   . Over weight   . Pulmonary nodules   . Small lymphocytic B-cell lymphoma involving skin ([place])   . Vitamin D deficiency     Past Surgical History:  Procedure Laterality Date  . BACK SURGERY  1994   L5, S1 herniated disc  . BREAST BIOPSY  2006   benign  . CRYOTHERAPY  1980's  . WISDOM TOOTH EXTRACTION     teenage years     Medications: Current Outpatient Medications  Medication Sig Dispense Refill  . acetaminophen (TYLENOL) 500 MG tablet Take 1,000 mg by mouth at bedtime as needed for mild pain, moderate pain, fever or headache.     . ALPRAZolam (XANAX) 0.25 MG tablet Take 0.25 mg by mouth at bedtime as needed for anxiety.    Marland Kitchen aspirin 81 MG tablet Take 81 mg by mouth daily.    Marland Kitchen atorvastatin (LIPITOR) 80 MG tablet Take 1 tablet (80 mg total) by mouth daily. 90 tablet 3  . cetirizine (ZYRTEC) 10 MG tablet Take 10 mg by mouth daily.    . Cholecalciferol (VITAMIN D) 2000 units CAPS Take 6,000 Units by mouth daily.    . citalopram (CELEXA) 20 MG tablet Take 20 mg by mouth daily.    Marland Kitchen docusate sodium (COLACE) 100 MG capsule Take 200 mg  by mouth daily.    . fenofibrate 160 MG tablet Take 1 tablet (160 mg total) by mouth daily. Please make yearly appt with Dr. Johnsie Cancel for February before anymore refills. 1st attempt 90 tablet 0  . MULTIPLE VITAMIN PO Take 1 tablet by mouth daily.     . Omega-3 Fatty Acids (FISH OIL PO) Take 4 tablets by mouth daily.    Marland Kitchen omeprazole (PRILOSEC) 20 MG capsule Take 20 mg by mouth daily.    . traMADol (ULTRAM) 50 MG tablet Take by mouth as directed.     No current facility-administered medications for this visit.     Allergies: Allergies  Allergen Reactions  . Iodine Hives  . Ivp Dye [Iodinated Diagnostic Agents] Hives  . Melatonin Other (See Comments)    Dry mouth    Social History: The patient  reports that she has quit smoking. She has quit using smokeless tobacco. She reports that she drinks alcohol. She reports that she does not use drugs.   Family History: The patient's family history includes Kidney disease in her father; Pancreatic cancer in her mother; Thyroid disease in her sister.   Review of Systems: Please see the history of present illness.   Otherwise, the review of systems is positive  for none.   All other systems are reviewed and negative.   Physical Exam: VS:  BP (!) 148/80   Pulse 70   Ht 5\' 3"  (1.6 m)   Wt 167 lb 8 oz (76 kg)   SpO2 99%   BMI 29.67 kg/m  .  BMI Body mass index is 29.67 kg/m.  Wt Readings from Last 3 Encounters:  06/14/17 167 lb 8 oz (76 kg)  06/07/16 164 lb 12.8 oz (74.8 kg)  05/14/15 169 lb (76.7 kg)   Affect appropriate Healthy:  appears stated age HEENT: normal Neck supple with no adenopathy JVP normal no bruits no thyromegaly Lungs clear with no wheezing and good diaphragmatic motion Heart:  S1/S2 no murmur, no rub, gallop or click PMI normal Abdomen: benighn, BS positve, no tenderness, no AAA no bruit.  No HSM or HJR Distal pulses intact with no bruits No edema Neuro non-focal Skin warm and dry No muscular  weakness    LABORATORY DATA:  EKG:  06/07/16  NSR. Normal ECG 06/14/17 SR rate 67 normal   Lab Results  Component Value Date   GLUCOSE 96 06/07/2016   CHOL 133 12/19/2016   TRIG 303 (H) 12/19/2016   HDL 31 (L) 12/19/2016   LDLDIRECT 56 12/19/2016   LDLCALC 41 12/19/2016   ALT 25 12/19/2016   AST 30 12/19/2016   NA 140 06/07/2016   K 4.8 06/07/2016   CL 99 06/07/2016   CREATININE 0.91 06/07/2016   BUN 23 06/07/2016   CO2 23 06/07/2016   TSH 1.918 05/14/2015     Other Studies Reviewed Today:   Assessment/Plan: 1. HLD -  Continue statin and fibrates repeat labs and f/u lipid clinic September  Suggested she have coronary calcium score to risk stratify for CAD  2. Elevated BP - stable and improved   3. Back pain - may be looking at surgery - mobility encouraged.   4. Depression continue celexa f/u primary   Jenkins Rouge

## 2017-06-14 ENCOUNTER — Encounter: Payer: Self-pay | Admitting: Cardiovascular Disease

## 2017-06-14 ENCOUNTER — Ambulatory Visit: Payer: PPO | Admitting: Cardiovascular Disease

## 2017-06-14 ENCOUNTER — Encounter (INDEPENDENT_AMBULATORY_CARE_PROVIDER_SITE_OTHER): Payer: Self-pay

## 2017-06-14 VITALS — BP 148/80 | HR 70 | Ht 63.0 in | Wt 167.5 lb

## 2017-06-14 DIAGNOSIS — E785 Hyperlipidemia, unspecified: Secondary | ICD-10-CM

## 2017-06-14 DIAGNOSIS — I10 Essential (primary) hypertension: Secondary | ICD-10-CM

## 2017-06-14 DIAGNOSIS — E782 Mixed hyperlipidemia: Secondary | ICD-10-CM

## 2017-06-14 NOTE — Patient Instructions (Addendum)
Medication Instructions:  Your physician recommends that you continue on your current medications as directed. Please refer to the Current Medication list given to you today.  Labwork: NONE  Testing/Procedures: NONE  Follow-Up: Your physician recommends that you schedule a follow-up appointment in: September with [facility].  Your physician wants you to follow-up in: 12 months with Dr. Johnsie Cancel. You will receive a reminder letter in the mail two months in advance. If you don't receive a letter, please call our office to schedule the follow-up appointment.   If you need a refill on your cardiac medications before your next appointment, please call your pharmacy.

## 2017-06-19 DIAGNOSIS — C8519 Unspecified B-cell lymphoma, extranodal and solid organ sites: Secondary | ICD-10-CM | POA: Diagnosis not present

## 2017-06-21 DIAGNOSIS — M5126 Other intervertebral disc displacement, lumbar region: Secondary | ICD-10-CM | POA: Diagnosis not present

## 2017-06-23 ENCOUNTER — Other Ambulatory Visit: Payer: Self-pay | Admitting: Neurosurgery

## 2017-06-23 DIAGNOSIS — M5126 Other intervertebral disc displacement, lumbar region: Secondary | ICD-10-CM

## 2017-06-23 NOTE — Progress Notes (Signed)
Phone call to pt, reviewed myelogram procedure, verified allergies and medication list. Pt informed she will need to stop celexa and tramadol 48hrs prior to appoint time. Pt verbalized understanding.

## 2017-07-14 ENCOUNTER — Ambulatory Visit
Admission: RE | Admit: 2017-07-14 | Discharge: 2017-07-14 | Disposition: A | Discharge status: Home / Self Care | Payer: PPO | Source: Ambulatory Visit | Attending: Neurosurgery | Admitting: Neurosurgery

## 2017-07-14 DIAGNOSIS — M5126 Other intervertebral disc displacement, lumbar region: Secondary | ICD-10-CM

## 2017-07-14 MED ORDER — DIAZEPAM 5 MG PO TABS
5.0000 mg | ORAL_TABLET | Freq: Once | ORAL | Status: AC
  Administered 2017-07-14: 5 mg via ORAL

## 2017-07-14 MED ORDER — IOPAMIDOL (ISOVUE-M 200) INJECTION 41%
15.0000 mL | Freq: Once | INTRAMUSCULAR | Status: AC
  Administered 2017-07-14: 15 mL via INTRATHECAL

## 2017-07-14 NOTE — Discharge Instructions (Signed)
Myelogram Discharge Instructions  1. Go home and rest quietly for the next 24 hours.  It is important to lie flat for the next 24 hours.  Get up only to go to the restroom.  You may lie in the bed or on a couch on your back, your stomach, your left side or your right side.  You may have one pillow under your head.  You may have pillows between your knees while you are on your side or under your knees while you are on your back.  2. DO NOT drive today.  Recline the seat as far back as it will go, while still wearing your seat belt, on the way home.  3. You may get up to go to the bathroom as needed.  You may sit up for 10 minutes to eat.  You may resume your normal diet and medications unless otherwise indicated.  Drink lots of extra fluids today and tomorrow.  4. The incidence of headache, nausea, or vomiting is about 5% (one in 20 patients).  If you develop a headache, lie flat and drink plenty of fluids until the headache goes away.  Caffeinated beverages may be helpful.  If you develop severe nausea and vomiting or a headache that does not go away with flat bed rest, call [phone].  5. You may resume normal activities after your 24 hours of bed rest is over; however, do not exert yourself strongly or do any heavy lifting tomorrow. If when you get up you have a headache when standing, go back to bed and force fluids for another 24 hours.  6. Call your physician for a follow-up appointment.  The results of your myelogram will be sent directly to your physician by the following day.  7. If you have any questions or if complications develop after you arrive home, please call [phone].  Discharge instructions have been explained to the patient.  The patient, or the person responsible for the patient, fully understands these instructions.  YOU MAY RESTART YOUR CELEXA AND TRAMADOL TOMORROW 07/15/2017 AT 09:30AM.

## 2017-07-19 ENCOUNTER — Other Ambulatory Visit: Payer: Self-pay

## 2017-07-19 ENCOUNTER — Other Ambulatory Visit: Payer: Self-pay | Admitting: Cardiovascular Disease

## 2017-07-19 MED ORDER — ATORVASTATIN CALCIUM 80 MG PO TABS
80.0000 mg | ORAL_TABLET | Freq: Every day | ORAL | 3 refills | Status: DC
Start: 2017-07-19 — End: 2018-07-19

## 2017-08-02 DIAGNOSIS — M4316 Spondylolisthesis, lumbar region: Secondary | ICD-10-CM | POA: Diagnosis not present

## 2017-08-02 DIAGNOSIS — Z6828 Body mass index (BMI) 28.0-28.9, adult: Secondary | ICD-10-CM | POA: Diagnosis not present

## 2017-08-02 DIAGNOSIS — I10 Essential (primary) hypertension: Secondary | ICD-10-CM | POA: Diagnosis not present

## 2017-08-09 ENCOUNTER — Other Ambulatory Visit: Payer: Self-pay | Admitting: Neurosurgery

## 2017-08-11 DIAGNOSIS — M8588 Other specified disorders of bone density and structure, other site: Secondary | ICD-10-CM | POA: Diagnosis not present

## 2017-08-11 DIAGNOSIS — Z1231 Encounter for screening mammogram for malignant neoplasm of breast: Secondary | ICD-10-CM | POA: Diagnosis not present

## 2017-08-11 DIAGNOSIS — E2839 Other primary ovarian failure: Secondary | ICD-10-CM | POA: Diagnosis not present

## 2017-08-25 NOTE — Pre-Procedure Instructions (Addendum)
Tracy Rasmussen  08/25/2017      [place] [address], [place] [address] [place] [address] Phone: [phone] Fax: [phone]  [place], [place] [facility] [address] [place] [address] [place] [address] Phone: [phone] Fax: [phone]    Your procedure is scheduled on Tues., Sep 05, 2017 from 8:00AM- 10:46AM  Report to [facility] Admitting Entrance "A" at 6:00AM  Call this number if you have problems the morning of surgery:  [phone]   Remember:  No food or drink after midnight on May 27th  Take these medicines the morning of surgery with A SIP OF WATER: Citalopram (CELEXA) and Omeprazole ([place]). If needed Acetaminophen (TYLENOL) for mild pain and TraMADol (ULTRAM) for moderate-severe pain  As of today, stop taking all Other Aspirin Products, Vitamins, Fish oils, and Herbal medications. Also stop all NSAIDS i.e. Advil, Ibuprofen, Motrin, Aleve, Anaprox, Naproxen, BC and Goody Powders. Including:  Melatonin     Do not wear jewelry, make-up or nail polish (fingers).  Do not wear lotions, powders, or perfumes, or deodorant.  Do not shave 48 hours prior to surgery.   Do not bring valuables to the hospital.  [facility] is not responsible for any belongings or valuables.  Contacts, dentures or bridgework may not be worn into surgery.  Leave your suitcase in the car.  After surgery it may be brought to your room.  For patients admitted to the hospital, discharge time will be determined by your treatment team.  Patients discharged the day of surgery will not be allowed to drive home.   Special instructions:   [facility]- Preparing For Surgery  Before surgery, you can play an important role. Because skin is not sterile, your skin needs to be as free of germs as possible. You can reduce the number of germs on your skin by washing with CHG (chlorahexidine  gluconate) Soap before surgery.  CHG is an antiseptic cleaner which kills germs and bonds with the skin to continue killing germs even after washing.  Oral Hygiene is also important to reduce your risk of infection.  Remember - BRUSH YOUR TEETH THE MORNING OF SURGERY  Please do not use if you have an allergy to CHG or antibacterial soaps. If your skin becomes reddened/irritated stop using the CHG.  Do not shave (including legs and underarms) for at least 48 hours prior to first CHG shower. It is OK to shave your face.  Please follow these instructions carefully.   1. Shower the NIGHT BEFORE SURGERY and the MORNING OF SURGERY with CHG.   2. If you chose to wash your hair, wash your hair first as usual with your normal shampoo.  3. After you shampoo, rinse your hair and body thoroughly to remove the shampoo.  4. Use CHG as you would any other liquid soap. You can apply CHG directly to the skin and wash gently with a scrungie or a clean washcloth.   5. Apply the CHG Soap to your body ONLY FROM THE NECK DOWN.  Do not use on open wounds or open sores. Avoid contact with your eyes, ears, mouth and genitals (private parts). Wash Face and genitals (private parts)  with your normal soap.  6. Wash thoroughly, paying special attention to the area where your surgery will be performed.  7. Thoroughly rinse your body with warm water from the neck down.  8.  DO NOT shower/wash with your normal soap after using and rinsing off the CHG Soap.  9. Pat yourself dry with a CLEAN TOWEL.  10. Wear CLEAN PAJAMAS to bed the night before surgery, wear comfortable clothes the morning of surgery  11. Place CLEAN SHEETS on your bed the night of your first shower and DO NOT SLEEP WITH PETS.  Day of Surgery:  Do not apply any deodorants/lotions.  Please wear clean clothes to the hospital/surgery center.   Remember to brush your teeth.    Please read over the following fact sheets that you were given. Pain  Booklet, Coughing and Deep Breathing, MRSA Information and Surgical Site Infection Prevention

## 2017-08-28 ENCOUNTER — Encounter (HOSPITAL_COMMUNITY)
Admission: RE | Admit: 2017-08-28 | Discharge: 2017-08-28 | Disposition: A | Discharge status: Home / Self Care | Payer: PPO | Source: Ambulatory Visit | Attending: Neurosurgery | Admitting: Neurosurgery

## 2017-08-28 ENCOUNTER — Encounter (HOSPITAL_COMMUNITY): Payer: Self-pay

## 2017-08-28 ENCOUNTER — Other Ambulatory Visit: Payer: Self-pay

## 2017-08-28 ENCOUNTER — Other Ambulatory Visit: Payer: Self-pay | Admitting: Neurosurgery

## 2017-08-28 DIAGNOSIS — Z01812 Encounter for preprocedural laboratory examination: Secondary | ICD-10-CM | POA: Diagnosis not present

## 2017-08-28 HISTORY — DX: Spondylolisthesis, lumbar region: M43.16

## 2017-08-28 HISTORY — DX: Other complications of anesthesia, initial encounter: T88.59XA

## 2017-08-28 HISTORY — DX: Nausea with vomiting, unspecified: R11.2

## 2017-08-28 HISTORY — DX: Adverse effect of unspecified anesthetic, initial encounter: T41.45XA

## 2017-08-28 HISTORY — DX: Other specified postprocedural states: Z98.890

## 2017-08-28 LAB — CBC WITH DIFFERENTIAL/PLATELET
Abs Immature Granulocytes: 0 10*3/uL (ref 0.0–0.1)
Basophils Absolute: 0.1 10*3/uL (ref 0.0–0.1)
Basophils Relative: 1 %
EOS ABS: 0.2 10*3/uL (ref 0.0–0.7)
Eosinophils Relative: 2 %
HEMATOCRIT: 44 % (ref 36.0–46.0)
Hemoglobin: 13.9 g/dL (ref 12.0–15.0)
Immature Granulocytes: 0 %
Lymphocytes Relative: 29 %
Lymphs Abs: 2.7 10*3/uL (ref 0.7–4.0)
MCH: 28.3 pg (ref 26.0–34.0)
MCHC: 31.6 g/dL (ref 30.0–36.0)
MCV: 89.6 fL (ref 78.0–100.0)
MONO ABS: 0.5 10*3/uL (ref 0.1–1.0)
MONOS PCT: 6 %
Neutro Abs: 5.9 10*3/uL (ref 1.7–7.7)
Neutrophils Relative %: 62 %
Platelets: 292 10*3/uL (ref 150–400)
RBC: 4.91 MIL/uL (ref 3.87–5.11)
RDW: 13.3 % (ref 11.5–15.5)
WBC: 9.4 10*3/uL (ref 4.0–10.5)

## 2017-08-28 LAB — TYPE AND SCREEN
ABO/RH(D): B POS
Antibody Screen: NEGATIVE

## 2017-08-28 LAB — BASIC METABOLIC PANEL
ANION GAP: 11 (ref 5–15)
BUN: 17 mg/dL (ref 6–20)
CALCIUM: 9.9 mg/dL (ref 8.9–10.3)
CO2: 24 mmol/L (ref 22–32)
Chloride: 106 mmol/L (ref 101–111)
Creatinine, Ser: 0.91 mg/dL (ref 0.44–1.00)
GFR calc non Af Amer: >60 mL/min (ref >60)
GLUCOSE: 106 mg/dL — AB (ref 65–99)
POTASSIUM: 4.1 mmol/L (ref 3.5–5.1)
Sodium: 141 mmol/L (ref 135–145)

## 2017-08-28 LAB — ABO/RH: ABO/RH(D): B POS

## 2017-08-28 LAB — SURGICAL PCR SCREEN
MRSA, PCR: NEGATIVE
Staphylococcus aureus: NEGATIVE

## 2017-08-28 NOTE — Progress Notes (Addendum)
PCP - Dr. Kathyrn Lass  Cardiologist - Dr. Jenkins Rouge  Chest x-ray - Denies  EKG - 06/14/17 (E)  Stress Test - Denies  ECHO - Denies  Cardiac Cath - Denies  Sleep Study - Denies CPAP - None  LABS- 08/28/17: CBC w/D, BMP, T/S  ASA- Denies  Consent not signed due to pt stating the consent should include "Repairing the Herniation", along with the fusion. Will relay to Dr. Marchelle Folks office.  Anesthesia- No  Pt denies having chest pain, sob, or fever at this time. All instructions explained to the pt, with a verbal understanding of the material. Pt agrees to go over the instructions while at home for a better understanding. The opportunity to ask questions was provided.

## 2017-09-05 ENCOUNTER — Inpatient Hospital Stay (HOSPITAL_COMMUNITY)
Admission: RE | Admit: 2017-09-05 | Discharge: 2017-09-06 | DRG: 455 | Disposition: A | Discharge status: Home / Self Care | Payer: PPO | Attending: Neurosurgery | Admitting: Neurosurgery

## 2017-09-05 ENCOUNTER — Inpatient Hospital Stay (HOSPITAL_COMMUNITY): Payer: PPO

## 2017-09-05 ENCOUNTER — Encounter (HOSPITAL_COMMUNITY)
Admission: RE | Disposition: A | Discharge status: Home / Self Care | Payer: Self-pay | Source: Home / Self Care | Attending: Neurosurgery

## 2017-09-05 ENCOUNTER — Encounter (HOSPITAL_COMMUNITY): Payer: Self-pay

## 2017-09-05 ENCOUNTER — Inpatient Hospital Stay (HOSPITAL_COMMUNITY): Payer: PPO | Admitting: Certified Registered Nurse Anesthetist

## 2017-09-05 DIAGNOSIS — Z87891 Personal history of nicotine dependence: Secondary | ICD-10-CM

## 2017-09-05 DIAGNOSIS — F419 Anxiety disorder, unspecified: Secondary | ICD-10-CM | POA: Diagnosis not present

## 2017-09-05 DIAGNOSIS — N183 Chronic kidney disease, stage 3 (moderate): Secondary | ICD-10-CM | POA: Diagnosis not present

## 2017-09-05 DIAGNOSIS — E559 Vitamin D deficiency, unspecified: Secondary | ICD-10-CM | POA: Diagnosis not present

## 2017-09-05 DIAGNOSIS — Z8572 Personal history of non-Hodgkin lymphomas: Secondary | ICD-10-CM

## 2017-09-05 DIAGNOSIS — Z419 Encounter for procedure for purposes other than remedying health state, unspecified: Secondary | ICD-10-CM

## 2017-09-05 DIAGNOSIS — M48062 Spinal stenosis, lumbar region with neurogenic claudication: Secondary | ICD-10-CM | POA: Diagnosis not present

## 2017-09-05 DIAGNOSIS — Z888 Allergy status to other drugs, medicaments and biological substances status: Secondary | ICD-10-CM | POA: Diagnosis not present

## 2017-09-05 DIAGNOSIS — Z841 Family history of disorders of kidney and ureter: Secondary | ICD-10-CM | POA: Diagnosis not present

## 2017-09-05 DIAGNOSIS — Z79891 Long term (current) use of opiate analgesic: Secondary | ICD-10-CM | POA: Diagnosis not present

## 2017-09-05 DIAGNOSIS — Z8349 Family history of other endocrine, nutritional and metabolic diseases: Secondary | ICD-10-CM | POA: Diagnosis not present

## 2017-09-05 DIAGNOSIS — M4802 Spinal stenosis, cervical region: Secondary | ICD-10-CM | POA: Diagnosis not present

## 2017-09-05 DIAGNOSIS — Z91041 Radiographic dye allergy status: Secondary | ICD-10-CM

## 2017-09-05 DIAGNOSIS — E782 Mixed hyperlipidemia: Secondary | ICD-10-CM | POA: Diagnosis present

## 2017-09-05 DIAGNOSIS — M4326 Fusion of spine, lumbar region: Secondary | ICD-10-CM | POA: Diagnosis not present

## 2017-09-05 DIAGNOSIS — Z79899 Other long term (current) drug therapy: Secondary | ICD-10-CM | POA: Diagnosis not present

## 2017-09-05 DIAGNOSIS — Z800 Family history of malignant neoplasm of digestive organs: Secondary | ICD-10-CM

## 2017-09-05 DIAGNOSIS — M8580 Other specified disorders of bone density and structure, unspecified site: Secondary | ICD-10-CM | POA: Diagnosis not present

## 2017-09-05 DIAGNOSIS — M4310 Spondylolisthesis, site unspecified: Secondary | ICD-10-CM | POA: Diagnosis present

## 2017-09-05 DIAGNOSIS — M4316 Spondylolisthesis, lumbar region: Principal | ICD-10-CM | POA: Diagnosis present

## 2017-09-05 SURGERY — POSTERIOR LUMBAR FUSION 1 LEVEL
Anesthesia: General | Site: Back

## 2017-09-05 MED ORDER — ROCURONIUM BROMIDE 50 MG/5ML IV SOLN
INTRAVENOUS | Status: AC
  Filled 2017-09-05: qty 1

## 2017-09-05 MED ORDER — FENTANYL CITRATE (PF) 100 MCG/2ML IJ SOLN
INTRAMUSCULAR | Status: DC | PRN
  Administered 2017-09-05: 100 ug via INTRAVENOUS
  Administered 2017-09-05 (×3): 50 ug via INTRAVENOUS

## 2017-09-05 MED ORDER — DIPHENHYDRAMINE HCL 50 MG/ML IJ SOLN
INTRAMUSCULAR | Status: AC
  Filled 2017-09-05: qty 1

## 2017-09-05 MED ORDER — SODIUM CHLORIDE 0.9 % IV SOLN
INTRAVENOUS | Status: DC | PRN
  Administered 2017-09-05: 500 mL

## 2017-09-05 MED ORDER — OMEGA-3-ACID ETHYL ESTERS 1 G PO CAPS
1.0000 g | ORAL_CAPSULE | Freq: Two times a day (BID) | ORAL | Status: DC
  Administered 2017-09-05: 1 g via ORAL
  Filled 2017-09-05 (×2): qty 1

## 2017-09-05 MED ORDER — LIDOCAINE HCL (CARDIAC) PF 100 MG/5ML IV SOSY
PREFILLED_SYRINGE | INTRAVENOUS | Status: DC | PRN
  Administered 2017-09-05: 80 mg via INTRAVENOUS

## 2017-09-05 MED ORDER — LACTATED RINGERS IV SOLN
INTRAVENOUS | Status: DC | PRN
  Administered 2017-09-05 (×2): via INTRAVENOUS

## 2017-09-05 MED ORDER — OXYCODONE HCL 5 MG PO TABS
10.0000 mg | ORAL_TABLET | ORAL | Status: DC | PRN
  Administered 2017-09-05 – 2017-09-06 (×5): 10 mg via ORAL
  Filled 2017-09-05 (×5): qty 2

## 2017-09-05 MED ORDER — PHENYLEPHRINE HCL 10 MG/ML IJ SOLN
INTRAMUSCULAR | Status: DC | PRN
  Administered 2017-09-05: 40 ug via INTRAVENOUS
  Administered 2017-09-05: 80 ug via INTRAVENOUS
  Administered 2017-09-05: 40 ug via INTRAVENOUS

## 2017-09-05 MED ORDER — ATORVASTATIN CALCIUM 20 MG PO TABS
80.0000 mg | ORAL_TABLET | Freq: Every day | ORAL | Status: DC
  Administered 2017-09-05: 80 mg via ORAL
  Filled 2017-09-05: qty 4

## 2017-09-05 MED ORDER — ONDANSETRON HCL 4 MG PO TABS: 4.0000 mg | ORAL_TABLET | Freq: Four times a day (QID) | ORAL | Status: DC | PRN

## 2017-09-05 MED ORDER — HYDROCODONE-ACETAMINOPHEN 10-325 MG PO TABS: 1.0000 | ORAL_TABLET | ORAL | Status: DC | PRN

## 2017-09-05 MED ORDER — ROCURONIUM BROMIDE 50 MG/5ML IV SOLN
INTRAVENOUS | Status: AC
  Filled 2017-09-05: qty 4

## 2017-09-05 MED ORDER — EPHEDRINE SULFATE 50 MG/ML IJ SOLN
INTRAMUSCULAR | Status: AC
  Filled 2017-09-05: qty 1

## 2017-09-05 MED ORDER — PROMETHAZINE HCL 25 MG/ML IJ SOLN: 6.2500 mg | INTRAMUSCULAR | Status: DC | PRN

## 2017-09-05 MED ORDER — PHENOL 1.4 % MT LIQD: 1.0000 | OROMUCOSAL | Status: DC | PRN

## 2017-09-05 MED ORDER — CITALOPRAM HYDROBROMIDE 20 MG PO TABS
20.0000 mg | ORAL_TABLET | Freq: Every day | ORAL | Status: DC
  Administered 2017-09-06: 20 mg via ORAL
  Filled 2017-09-05: qty 1

## 2017-09-05 MED ORDER — EPHEDRINE SULFATE 50 MG/ML IJ SOLN
INTRAMUSCULAR | Status: DC | PRN
  Administered 2017-09-05 (×2): 5 mg via INTRAVENOUS

## 2017-09-05 MED ORDER — PROPOFOL 10 MG/ML IV BOLUS
INTRAVENOUS | Status: AC
  Filled 2017-09-05: qty 20

## 2017-09-05 MED ORDER — 0.9 % SODIUM CHLORIDE (POUR BTL) OPTIME
TOPICAL | Status: DC | PRN
  Administered 2017-09-05: 1000 mL

## 2017-09-05 MED ORDER — HYDROMORPHONE HCL 1 MG/ML IJ SOLN: 1.0000 mg | INTRAMUSCULAR | Status: DC | PRN

## 2017-09-05 MED ORDER — GLYCOPYRROLATE 0.2 MG/ML IJ SOLN
INTRAMUSCULAR | Status: DC | PRN
  Administered 2017-09-05 (×2): 0.1 mg via INTRAVENOUS

## 2017-09-05 MED ORDER — ACETAMINOPHEN 650 MG RE SUPP: 650.0000 mg | RECTAL | Status: DC | PRN

## 2017-09-05 MED ORDER — VITAMIN D 1000 UNITS PO TABS
5000.0000 [IU] | ORAL_TABLET | Freq: Every day | ORAL | Status: DC
  Filled 2017-09-05: qty 5

## 2017-09-05 MED ORDER — BISACODYL 10 MG RE SUPP: 10.0000 mg | Freq: Every day | RECTAL | Status: DC | PRN

## 2017-09-05 MED ORDER — HYDROMORPHONE HCL 2 MG/ML IJ SOLN
0.2500 mg | INTRAMUSCULAR | Status: DC | PRN
  Administered 2017-09-05 (×2): 0.5 mg via INTRAVENOUS

## 2017-09-05 MED ORDER — SUGAMMADEX SODIUM 200 MG/2ML IV SOLN
INTRAVENOUS | Status: DC | PRN
  Administered 2017-09-05: 200 mg via INTRAVENOUS

## 2017-09-05 MED ORDER — DIAZEPAM 5 MG PO TABS
5.0000 mg | ORAL_TABLET | Freq: Four times a day (QID) | ORAL | Status: DC | PRN
  Administered 2017-09-05 (×2): 5 mg via ORAL
  Filled 2017-09-05: qty 1

## 2017-09-05 MED ORDER — CEFAZOLIN SODIUM-DEXTROSE 2-4 GM/100ML-% IV SOLN: 2.0000 g | INTRAVENOUS | Status: DC

## 2017-09-05 MED ORDER — THROMBIN 20000 UNITS EX SOLR
CUTANEOUS | Status: AC
  Filled 2017-09-05: qty 20000

## 2017-09-05 MED ORDER — DIPHENHYDRAMINE HCL 50 MG/ML IJ SOLN
INTRAMUSCULAR | Status: DC | PRN
  Administered 2017-09-05: 12.5 mg via INTRAVENOUS

## 2017-09-05 MED ORDER — BUPIVACAINE HCL (PF) 0.25 % IJ SOLN
INTRAMUSCULAR | Status: DC | PRN
  Administered 2017-09-05: 20 mL

## 2017-09-05 MED ORDER — ALPRAZOLAM 0.25 MG PO TABS: 0.2500 mg | ORAL_TABLET | Freq: Every day | ORAL | Status: DC

## 2017-09-05 MED ORDER — BUPIVACAINE HCL (PF) 0.25 % IJ SOLN
INTRAMUSCULAR | Status: AC
  Filled 2017-09-05: qty 30

## 2017-09-05 MED ORDER — CEFAZOLIN SODIUM-DEXTROSE 2-4 GM/100ML-% IV SOLN
2.0000 g | INTRAVENOUS | Status: AC
  Administered 2017-09-05: 2 g via INTRAVENOUS

## 2017-09-05 MED ORDER — ONDANSETRON HCL 4 MG/2ML IJ SOLN
INTRAMUSCULAR | Status: AC
  Filled 2017-09-05: qty 2

## 2017-09-05 MED ORDER — PROPOFOL 10 MG/ML IV BOLUS
INTRAVENOUS | Status: DC | PRN
  Administered 2017-09-05: 150 mg via INTRAVENOUS

## 2017-09-05 MED ORDER — ACETAMINOPHEN 325 MG PO TABS
650.0000 mg | ORAL_TABLET | ORAL | Status: DC | PRN
  Administered 2017-09-05 – 2017-09-06 (×2): 650 mg via ORAL
  Filled 2017-09-05 (×2): qty 2

## 2017-09-05 MED ORDER — FENTANYL CITRATE (PF) 250 MCG/5ML IJ SOLN
INTRAMUSCULAR | Status: AC
  Filled 2017-09-05: qty 5

## 2017-09-05 MED ORDER — PANTOPRAZOLE SODIUM 40 MG PO TBEC
40.0000 mg | DELAYED_RELEASE_TABLET | Freq: Every day | ORAL | Status: DC
  Administered 2017-09-06: 40 mg via ORAL
  Filled 2017-09-05: qty 1

## 2017-09-05 MED ORDER — SODIUM CHLORIDE 0.9% FLUSH: 3.0000 mL | INTRAVENOUS | Status: DC | PRN

## 2017-09-05 MED ORDER — LORATADINE 10 MG PO TABS
10.0000 mg | ORAL_TABLET | Freq: Every day | ORAL | Status: DC
  Administered 2017-09-05: 10 mg via ORAL
  Filled 2017-09-05 (×3): qty 1

## 2017-09-05 MED ORDER — OXYCODONE HCL 5 MG PO TABS
5.0000 mg | ORAL_TABLET | Freq: Once | ORAL | Status: AC | PRN
  Administered 2017-09-05: 5 mg via ORAL

## 2017-09-05 MED ORDER — OXYCODONE HCL 5 MG/5ML PO SOLN: 5.0000 mg | Freq: Once | ORAL | Status: AC | PRN

## 2017-09-05 MED ORDER — LIDOCAINE 2% (20 MG/ML) 5 ML SYRINGE
INTRAMUSCULAR | Status: AC
  Filled 2017-09-05: qty 5

## 2017-09-05 MED ORDER — TRAMADOL HCL 50 MG PO TABS: 50.0000 mg | ORAL_TABLET | Freq: Four times a day (QID) | ORAL | Status: DC | PRN

## 2017-09-05 MED ORDER — SODIUM CHLORIDE 0.9% FLUSH
3.0000 mL | Freq: Two times a day (BID) | INTRAVENOUS | Status: DC
  Administered 2017-09-05: 3 mL via INTRAVENOUS

## 2017-09-05 MED ORDER — SCOPOLAMINE 1 MG/3DAYS TD PT72
MEDICATED_PATCH | TRANSDERMAL | Status: DC | PRN
  Administered 2017-09-05: 1 via TRANSDERMAL

## 2017-09-05 MED ORDER — THROMBIN (RECOMBINANT) 20000 UNITS EX SOLR
CUTANEOUS | Status: DC | PRN
  Administered 2017-09-05: 20 mL via TOPICAL

## 2017-09-05 MED ORDER — CHLORHEXIDINE GLUCONATE CLOTH 2 % EX PADS: 6.0000 | MEDICATED_PAD | Freq: Once | CUTANEOUS | Status: DC

## 2017-09-05 MED ORDER — CEFAZOLIN SODIUM-DEXTROSE 2-4 GM/100ML-% IV SOLN
INTRAVENOUS | Status: AC
  Filled 2017-09-05: qty 100

## 2017-09-05 MED ORDER — CEFAZOLIN SODIUM-DEXTROSE 1-4 GM/50ML-% IV SOLN
1.0000 g | Freq: Three times a day (TID) | INTRAVENOUS | Status: AC
  Administered 2017-09-05 (×2): 1 g via INTRAVENOUS
  Filled 2017-09-05 (×2): qty 50

## 2017-09-05 MED ORDER — ADULT MULTIVITAMIN W/MINERALS CH
ORAL_TABLET | Freq: Every day | ORAL | Status: DC
  Filled 2017-09-05: qty 1

## 2017-09-05 MED ORDER — MELATONIN 3 MG PO TABS
3.0000 mg | ORAL_TABLET | Freq: Every day | ORAL | Status: DC
  Administered 2017-09-05: 3 mg via ORAL
  Filled 2017-09-05: qty 1

## 2017-09-05 MED ORDER — PHENYLEPHRINE 40 MCG/ML (10ML) SYRINGE FOR IV PUSH (FOR BLOOD PRESSURE SUPPORT)
PREFILLED_SYRINGE | INTRAVENOUS | Status: AC
  Filled 2017-09-05: qty 10

## 2017-09-05 MED ORDER — FLEET ENEMA 7-19 GM/118ML RE ENEM: 1.0000 | ENEMA | Freq: Once | RECTAL | Status: DC | PRN

## 2017-09-05 MED ORDER — DIAZEPAM 5 MG PO TABS
ORAL_TABLET | ORAL | Status: AC
  Filled 2017-09-05: qty 1

## 2017-09-05 MED ORDER — VANCOMYCIN HCL 1 G IV SOLR
INTRAVENOUS | Status: DC | PRN
  Administered 2017-09-05: 1000 mg

## 2017-09-05 MED ORDER — MEPERIDINE HCL 50 MG/ML IJ SOLN: 6.2500 mg | INTRAMUSCULAR | Status: DC | PRN

## 2017-09-05 MED ORDER — MIDAZOLAM HCL 5 MG/5ML IJ SOLN
INTRAMUSCULAR | Status: DC | PRN
  Administered 2017-09-05: 2 mg via INTRAVENOUS

## 2017-09-05 MED ORDER — DEXAMETHASONE SODIUM PHOSPHATE 10 MG/ML IJ SOLN
10.0000 mg | INTRAMUSCULAR | Status: AC
  Administered 2017-09-05: 10 mg via INTRAVENOUS
  Filled 2017-09-05: qty 1

## 2017-09-05 MED ORDER — OXYCODONE HCL 5 MG PO TABS
ORAL_TABLET | ORAL | Status: AC
  Filled 2017-09-05: qty 1

## 2017-09-05 MED ORDER — ONDANSETRON HCL 4 MG/2ML IJ SOLN: 4.0000 mg | Freq: Four times a day (QID) | INTRAMUSCULAR | Status: DC | PRN

## 2017-09-05 MED ORDER — SCOPOLAMINE 1 MG/3DAYS TD PT72
MEDICATED_PATCH | TRANSDERMAL | Status: AC
Start: 2017-09-05 — End: 2017-09-05
  Filled 2017-09-05: qty 1

## 2017-09-05 MED ORDER — ROCURONIUM BROMIDE 100 MG/10ML IV SOLN
INTRAVENOUS | Status: DC | PRN
  Administered 2017-09-05: 10 mg via INTRAVENOUS
  Administered 2017-09-05: 5 mg via INTRAVENOUS
  Administered 2017-09-05: 50 mg via INTRAVENOUS

## 2017-09-05 MED ORDER — FENOFIBRATE 160 MG PO TABS
160.0000 mg | ORAL_TABLET | Freq: Every day | ORAL | Status: DC
  Administered 2017-09-06: 160 mg via ORAL
  Filled 2017-09-05: qty 1

## 2017-09-05 MED ORDER — ONDANSETRON HCL 4 MG/2ML IJ SOLN
INTRAMUSCULAR | Status: DC | PRN
  Administered 2017-09-05: 4 mg via INTRAVENOUS

## 2017-09-05 MED ORDER — HYDROMORPHONE HCL 2 MG/ML IJ SOLN
INTRAMUSCULAR | Status: AC
  Filled 2017-09-05: qty 1

## 2017-09-05 MED ORDER — MIDAZOLAM HCL 2 MG/2ML IJ SOLN
INTRAMUSCULAR | Status: AC
Start: 2017-09-05 — End: ?
  Filled 2017-09-05: qty 2

## 2017-09-05 MED ORDER — SODIUM CHLORIDE 0.9 % IJ SOLN
INTRAMUSCULAR | Status: AC
  Filled 2017-09-05: qty 20

## 2017-09-05 MED ORDER — MENTHOL 3 MG MT LOZG: 1.0000 | LOZENGE | OROMUCOSAL | Status: DC | PRN

## 2017-09-05 MED ORDER — POLYETHYLENE GLYCOL 3350 17 G PO PACK: 17.0000 g | PACK | Freq: Every day | ORAL | Status: DC | PRN

## 2017-09-05 MED ORDER — VANCOMYCIN HCL 1000 MG IV SOLR
INTRAVENOUS | Status: AC
  Filled 2017-09-05: qty 1000

## 2017-09-05 SURGICAL SUPPLY — 69 items
BAG DECANTER FOR FLEXI CONT (MISCELLANEOUS) ×3 IMPLANT
BENZOIN TINCTURE PRP APPL 2/3 (GAUZE/BANDAGES/DRESSINGS) ×3 IMPLANT
BLADE CLIPPER SURG (BLADE) IMPLANT
BUR CUTTER 7.0 ROUND (BURR) IMPLANT
BUR MATCHSTICK NEURO 3.0 LAGG (BURR) ×3 IMPLANT
CANISTER SUCT 3000ML PPV (MISCELLANEOUS) ×3 IMPLANT
CAP LCK SPNE (Orthopedic Implant) ×4 IMPLANT
CAP LOCK SPINE RADIUS (Orthopedic Implant) ×4 IMPLANT
CAP LOCKING (Orthopedic Implant) ×8 IMPLANT
CARTRIDGE OIL MAESTRO DRILL (MISCELLANEOUS) ×1 IMPLANT
CLOSURE STERI-STRIP 1/2X4 (GAUZE/BANDAGES/DRESSINGS) ×1
CLOSURE WOUND 1/2 X4 (GAUZE/BANDAGES/DRESSINGS) ×2
CLSR STERI-STRIP ANTIMIC 1/2X4 (GAUZE/BANDAGES/DRESSINGS) ×2 IMPLANT
CONT SPEC 4OZ CLIKSEAL STRL BL (MISCELLANEOUS) ×3 IMPLANT
COVER BACK TABLE 60X90IN (DRAPES) ×3 IMPLANT
DECANTER SPIKE VIAL GLASS SM (MISCELLANEOUS) ×3 IMPLANT
DERMABOND ADVANCED (GAUZE/BANDAGES/DRESSINGS) ×2
DERMABOND ADVANCED .7 DNX12 (GAUZE/BANDAGES/DRESSINGS) ×1 IMPLANT
DEVICE INTERBODY ELEVATE 23X8 (Cage) ×4 IMPLANT
DIFFUSER DRILL AIR PNEUMATIC (MISCELLANEOUS) ×3 IMPLANT
DRAPE C-ARM 42X72 X-RAY (DRAPES) ×6 IMPLANT
DRAPE HALF SHEET 40X57 (DRAPES) ×3 IMPLANT
DRAPE LAPAROTOMY 100X72X124 (DRAPES) ×3 IMPLANT
DRAPE SURG 17X23 STRL (DRAPES) ×12 IMPLANT
DRSG OPSITE POSTOP 4X6 (GAUZE/BANDAGES/DRESSINGS) ×3 IMPLANT
DURAPREP 26ML APPLICATOR (WOUND CARE) ×3 IMPLANT
ELECT REM PT RETURN 9FT ADLT (ELECTROSURGICAL) ×3
ELECTRODE REM PT RTRN 9FT ADLT (ELECTROSURGICAL) ×1 IMPLANT
EVACUATOR 1/8 PVC DRAIN (DRAIN) IMPLANT
GAUZE SPONGE 4X4 12PLY STRL (GAUZE/BANDAGES/DRESSINGS) IMPLANT
GAUZE SPONGE 4X4 16PLY XRAY LF (GAUZE/BANDAGES/DRESSINGS) IMPLANT
GLOVE BIOGEL PI IND STRL 6.5 (GLOVE) ×2 IMPLANT
GLOVE BIOGEL PI IND STRL 7.5 (GLOVE) ×2 IMPLANT
GLOVE BIOGEL PI INDICATOR 6.5 (GLOVE) ×4
GLOVE BIOGEL PI INDICATOR 7.5 (GLOVE) ×4
GLOVE ECLIPSE 6.5 STRL STRAW (GLOVE) ×3 IMPLANT
GLOVE ECLIPSE 9.0 STRL (GLOVE) ×6 IMPLANT
GLOVE EXAM NITRILE LRG STRL (GLOVE) IMPLANT
GLOVE EXAM NITRILE XL STR (GLOVE) IMPLANT
GLOVE EXAM NITRILE XS STR PU (GLOVE) IMPLANT
GLOVE SURG SS PI 7.5 STRL IVOR (GLOVE) ×15 IMPLANT
GOWN STRL REUS W/ TWL LRG LVL3 (GOWN DISPOSABLE) ×3 IMPLANT
GOWN STRL REUS W/ TWL XL LVL3 (GOWN DISPOSABLE) ×2 IMPLANT
GOWN STRL REUS W/TWL 2XL LVL3 (GOWN DISPOSABLE) IMPLANT
GOWN STRL REUS W/TWL LRG LVL3 (GOWN DISPOSABLE) ×6
GOWN STRL REUS W/TWL XL LVL3 (GOWN DISPOSABLE) ×4
GRAFT BN 5X1XSPNE CVD POST DBM (Bone Implant) ×1 IMPLANT
GRAFT BONE MAGNIFUSE 1X5CM (Bone Implant) ×2 IMPLANT
KIT BASIN OR (CUSTOM PROCEDURE TRAY) ×3 IMPLANT
KIT TURNOVER KIT B (KITS) ×3 IMPLANT
MILL MEDIUM DISP (BLADE) ×3 IMPLANT
NEEDLE HYPO 22GX1.5 SAFETY (NEEDLE) ×3 IMPLANT
NS IRRIG 1000ML POUR BTL (IV SOLUTION) ×3 IMPLANT
OIL CARTRIDGE MAESTRO DRILL (MISCELLANEOUS) ×3
PACK LAMINECTOMY NEURO (CUSTOM PROCEDURE TRAY) ×3 IMPLANT
ROD RADIUS 35MM (Rod) ×6 IMPLANT
SCREW 5.75X40M (Screw) ×12 IMPLANT
SPACER SPNL STD 23X8XSTRL (Cage) ×2 IMPLANT
SPCR SPNL STD 23X8XSTRL (Cage) ×2 IMPLANT
SPONGE SURGIFOAM ABS GEL 100 (HEMOSTASIS) ×3 IMPLANT
STRIP CLOSURE SKIN 1/2X4 (GAUZE/BANDAGES/DRESSINGS) ×4 IMPLANT
SUT VIC AB 0 CT1 18XCR BRD8 (SUTURE) ×1 IMPLANT
SUT VIC AB 0 CT1 8-18 (SUTURE) ×2
SUT VIC AB 2-0 CT1 18 (SUTURE) ×3 IMPLANT
SUT VIC AB 3-0 SH 8-18 (SUTURE) ×3 IMPLANT
TOWEL GREEN STERILE (TOWEL DISPOSABLE) ×3 IMPLANT
TOWEL GREEN STERILE FF (TOWEL DISPOSABLE) ×3 IMPLANT
TRAY FOLEY MTR SLVR 16FR STAT (SET/KITS/TRAYS/PACK) ×3 IMPLANT
WATER STERILE IRR 1000ML POUR (IV SOLUTION) ×3 IMPLANT

## 2017-09-05 NOTE — Transfer of Care (Signed)
Immediate Anesthesia Transfer of Care Note  Patient: Tracy Rasmussen Setting  Procedure(s) Performed: POSTERIOR LUMBAR INTERBODY FUSION - LUMBAR FOUR-LUMBAR FIVE (N/A Back)  Patient Location: PACU  Anesthesia Type:General  Level of Consciousness: awake and alert   Airway & Oxygen Therapy: Patient Spontanous Breathing and Patient connected to nasal cannula oxygen  Post-op Assessment: Report given to RN and Post -op Vital signs reviewed and stable  Post vital signs: Reviewed and stable  Last Vitals:  Vitals Value Taken Time  BP 171/84 09/05/2017 10:24 AM  Temp    Pulse 77 09/05/2017 10:28 AM  Resp 17 09/05/2017 10:28 AM  SpO2 94 % 09/05/2017 10:28 AM  Vitals shown include unvalidated device data.  Last Pain:  Vitals:   09/05/17 0722  TempSrc:   PainSc: 6       Patients Stated Pain Goal: 3 (28/00/34 9179)  Complications: No apparent anesthesia complications

## 2017-09-05 NOTE — Brief Op Note (Signed)
09/05/2017  10:15 AM  PATIENT:  Tracy Rasmussen  67 y.o. female  PRE-OPERATIVE DIAGNOSIS:  Spondylolisthesis  POST-OPERATIVE DIAGNOSIS:  Spondylolisthesis  PROCEDURE:  Procedure(s): POSTERIOR LUMBAR INTERBODY FUSION - LUMBAR FOUR-LUMBAR FIVE (N/A)  SURGEON:  Surgeon(s) and Role:    * Kamiyah Kindel, Mallie Mussel, MD - Primary    * Ashok Pall, MD - Assisting  PHYSICIAN ASSISTANT:   ASSISTANTS:    ANESTHESIA:   general  EBL:  150 mL   BLOOD ADMINISTERED:none  DRAINS: none   LOCAL MEDICATIONS USED:  MARCAINE     SPECIMEN:  No Specimen  DISPOSITION OF SPECIMEN:  N/A  COUNTS:  YES  TOURNIQUET:  * No tourniquets in log *  DICTATION: .Dragon Dictation  PLAN OF CARE: Admit to inpatient   PATIENT DISPOSITION:  PACU - hemodynamically stable.   Delay start of Pharmacological VTE agent (>24hrs) due to surgical blood loss or risk of bleeding: yes

## 2017-09-05 NOTE — Anesthesia Preprocedure Evaluation (Signed)
Anesthesia Evaluation  Patient identified by MRN, date of birth, ID band Patient awake    Reviewed: Allergy & Precautions, NPO status , Patient's Chart, lab work & pertinent test results  History of Anesthesia Complications (+) PONV  Airway Mallampati: II  TM Distance: >3 FB Neck ROM: Full    Dental no notable dental hx.    Pulmonary neg pulmonary ROS, former smoker,    Pulmonary exam normal breath sounds clear to auscultation       Cardiovascular negative cardio ROS Normal cardiovascular exam Rhythm:Regular Rate:Normal     Neuro/Psych Anxiety negative neurological ROS  negative psych ROS   GI/Hepatic negative GI ROS, Neg liver ROS,   Endo/Other  negative endocrine ROS  Renal/GU Renal InsufficiencyRenal diseasenegative Renal ROS  negative genitourinary   Musculoskeletal negative musculoskeletal ROS (+)   Abdominal   Peds negative pediatric ROS (+)  Hematology negative hematology ROS (+)   Anesthesia Other Findings   Reproductive/Obstetrics negative OB ROS                             Anesthesia Physical Anesthesia Plan  ASA: II  Anesthesia Plan: General   Post-op Pain Management:    Induction: Intravenous  PONV Risk Score and Plan: 4 or greater and Ondansetron, Dexamethasone, Midazolam and Scopolamine patch - Pre-op  Airway Management Planned: Oral ETT  Additional Equipment:   Intra-op Plan:   Post-operative Plan: Extubation in OR  Informed Consent: I have reviewed the patients History and Physical, chart, labs and discussed the procedure including the risks, benefits and alternatives for the proposed anesthesia with the patient or authorized representative who has indicated his/her understanding and acceptance.   Dental advisory given  Plan Discussed with: CRNA  Anesthesia Plan Comments:         Anesthesia Quick Evaluation

## 2017-09-05 NOTE — Anesthesia Procedure Notes (Signed)
Procedure Name: Intubation Date/Time: 09/05/2017 8:06 AM Performed by: Inda Coke, CRNA Pre-anesthesia Checklist: Patient identified, Emergency Drugs available, Suction available and Patient being monitored Patient Re-evaluated:Patient Re-evaluated prior to induction Oxygen Delivery Method: Circle System Utilized Preoxygenation: Pre-oxygenation with 100% oxygen Induction Type: IV induction Ventilation: Mask ventilation without difficulty Laryngoscope Size: Mac and 3 Grade View: Grade I Tube type: Oral Tube size: 7.0 mm Number of attempts: 1 Airway Equipment and Method: Stylet and Oral airway Placement Confirmation: ETT inserted through vocal cords under direct vision,  positive ETCO2 and breath sounds checked- equal and bilateral Secured at: 21 cm Tube secured with: Tape Dental Injury: Teeth and Oropharynx as per pre-operative assessment

## 2017-09-05 NOTE — H&P (Signed)
Tracy Rasmussen is an 67 y.o. female.   Chief Complaint: Back and right leg pain 67 year old female with intractable back and right lower extremity pain HPI:.  Symptoms of failed conservative management.  Symptoms aggravated by standing or walking.  Patient status post previous L2-3 microdiscectomy and right L4-5 decompressive laminotomy.  Patient with CT myelogram demonstrate evidence of dynamic instability with significant facet arthropathy and foraminal stenosis at the L4-5 level.  Patient presents now for decompression and fusion at L4-5 in hopes of improving her symptoms.  Past Medical History:  Diagnosis Date  . Anxiety disorder   . Chronic kidney disease, stage III (moderate) (HCC)   . Complication of anesthesia   . Mixed hyperlipidemia   . Osteopenia   . Over weight   . PONV (postoperative nausea and vomiting)   . Pulmonary nodules   . Small lymphocytic B-cell lymphoma involving skin ([place])   . Spondylolisthesis of lumbar region   . Vitamin D deficiency     Past Surgical History:  Procedure Laterality Date  . BACK SURGERY  1994   L5, S1 herniated disc  . BREAST BIOPSY  2006   benign  . CRYOTHERAPY  1980's  . WISDOM TOOTH EXTRACTION     teenage years    Family History  Problem Relation Age of Onset  . Pancreatic cancer Mother   . Kidney disease Father   . Thyroid disease Sister   . Colon cancer Neg Hx   . Colon polyps Neg Hx   . Liver disease Neg Hx    Social History:  reports that she has quit smoking. She has quit using smokeless tobacco. She reports that she drinks alcohol. She reports that she does not use drugs.  Allergies:  Allergies  Allergen Reactions  . Iodine Hives  . Ivp Dye [Iodinated Diagnostic Agents] Hives    Medications Prior to Admission  Medication Sig Dispense Refill  . acetaminophen (TYLENOL) 500 MG tablet Take 1,000 mg by mouth at bedtime as needed for mild pain, moderate pain, fever or headache.     . ALPRAZolam (XANAX) 0.25 MG tablet  Take 0.25 mg by mouth at bedtime.     Marland Kitchen atorvastatin (LIPITOR) 80 MG tablet Take 1 tablet (80 mg total) by mouth daily. (Patient taking differently: Take 80 mg by mouth at bedtime. ) 90 tablet 3  . cetirizine (ZYRTEC) 10 MG tablet Take 10 mg by mouth at bedtime.     . Cholecalciferol (VITAMIN D3) 5000 units CAPS Take 5,000 Units by mouth daily after breakfast.    . citalopram (CELEXA) 20 MG tablet Take 20 mg by mouth daily.    . fenofibrate 160 MG tablet Take 1 tablet (160 mg total) by mouth daily. Please make yearly appt with Dr. Johnsie Cancel for February before anymore refills. 1st attempt (Patient taking differently: Take 160 mg by mouth daily after breakfast. Please make yearly appt with Dr. Johnsie Cancel for February before anymore refills. 1st attempt) 90 tablet 0  . Melatonin 3 MG CAPS Take 3 mg by mouth at bedtime.    . MULTIPLE VITAMIN PO Take 2 tablets by mouth daily after breakfast.     . Omega-3 Fatty Acids (FISH OIL) 1200 MG CAPS Take 3,600 mg by mouth daily after breakfast.     . omeprazole (PRILOSEC) 20 MG capsule Take 20 mg by mouth daily.    . traMADol (ULTRAM) 50 MG tablet Take 50-100 mg by mouth every 6 (six) hours as needed for moderate pain.  No results found for this or any previous visit (from the past 48 hour(s)). No results found.  Pertinent items noted in HPI and remainder of comprehensive ROS otherwise negative.  Blood pressure (!) 155/81, pulse 60, temperature 98.1 F (36.7 C), temperature source Oral, resp. rate 18, height 5\' 3"  (1.6 m), weight 73 kg (161 lb), SpO2 97 %.  Patient is awake and alert.  She is oriented and appropriate.  Her speech is fluent.  Her judgment and insight are intact.  Cranial nerve function normal bilaterally.  Motor examination with normal strength bilateral.  Sensory examination with some decreased sensation pinprick and light touch in her right L5 dermatome.  Deep tendon reflexes normal active.  No evidence of long track signs.  Gait antalgic.   Posture moderately flexed.  Examination head ears eyes nose throat is unremarkable her chest and abdomen are benign.  Extremities are free from injury deformity. Assessment/Plan Grade 1 L4-5 unstable degenerative/post laminectomy spondylolisthesis with stenosis and intractable pain.  Plan bilateral L4-5 decompressive laminotomies and foraminotomies followed by posterior lumbar interbody fusion utilizing interbody cages, locally harvested autograft, and augmented with posterior lateral arthrodesis utilizing nonsegmental pedicle screw fixation and local autografting.  Risks and benefits of been explained.  Patient wishes to proceed.  Mallie Mussel A Alphons Burgert 09/05/2017, 7:40 AM

## 2017-09-05 NOTE — Anesthesia Postprocedure Evaluation (Signed)
Anesthesia Post Note  Patient: Tracy Rasmussen  Procedure(s) Performed: POSTERIOR LUMBAR INTERBODY FUSION - LUMBAR FOUR-LUMBAR FIVE (N/A Back)     Patient location during evaluation: PACU Anesthesia Type: General Level of consciousness: awake and alert Pain management: pain level controlled Vital Signs Assessment: post-procedure vital signs reviewed and stable Respiratory status: spontaneous breathing, nonlabored ventilation and respiratory function stable Cardiovascular status: blood pressure returned to baseline and stable Postop Assessment: no apparent nausea or vomiting Anesthetic complications: no    Last Vitals:  Vitals:   09/05/17 1100 09/05/17 1135  BP: 139/65 (!) 162/75  Pulse: 75 72  Resp: 15 18  Temp:  36.6 C  SpO2: 97% 93%    Last Pain:  Vitals:   09/05/17 1135  TempSrc: Oral  PainSc:                  Lynda Rainwater

## 2017-09-05 NOTE — Op Note (Signed)
Date of procedure: 09/05/2017  Date of dictation: Same  Service: Neurosurgery  Preoperative diagnosis: Grade 1 L4-5 unstable degenerative/post laminectomy spondylolisthesis with stenosis and neurogenic claudication  Postoperative diagnosis: Same  Procedure Name: Bilateral L4-5 redo decompressive laminotomies with bilateral L4 and L5 foraminotomies, more than would be required for simple interbody fusion alone.  L4-5 posterior lumbar interbody fusion utilizing interbody cages and locally harvested autograft  L4-5 posterior lateral arthrodesis utilizing nonsegmental pedicle screw fixation and local autograft  Surgeon:Katryn Plummer A.Jaylianna Tatlock, M.D.  Asst. Surgeon: Christella Noa  Anesthesia: General  Indication: 67 year old female status post prior decompressive surgery at L4-5 on the right presents with worsening back and right lower extremity pain.  Work-up demonstrates evidence of a mobile L4-5 degenerative/post laminectomy spondylolisthesis with significant stenosis.  Patient presents now for decompression and fusion in hopes of improving her symptoms.  Operative note: After induction of anesthesia, patient position prone on the Wilson frame and appropriately padded.  Lumbar region prepped and draped sterilely.  Incision made overlying L4-5.  Dissection performed bilaterally.  Retractor placed.  Fluoroscopy used.  Level confirmed.  Bilateral decompressive laminotomies facetectomies and foraminotomies were then performed at L4-5 using [place] and high-speed drill to remove the inferior aspect of the lamina of L4 the entire pars interarticularis and inferior facet of L4 bilaterally and the majority of the superior facet of L5 bilaterally.  Ligament flavum and epidural scar were elevated and resected.  Decompressive foraminotomies completed on the course exiting L4 and L5 nerve roots bilaterally.  Bilateral discectomies then performed at L4-5.  The space then distracted.  With the disc space   distracted the disc space then prepared for interbody fusion.  With a distractor on the right side the disc space was cleaned of all soft tissue on the left side.  An 8 mm standard expandable [employer] cage packed with locally harvested autograft was then impacted in place and expanded to its full extent.  Distractor removed patient's right side.  The space once again prepared for interbody fusion.  Soft tissue removed the interspace.  Morselized autograft was packed in the interspace.  A second cage was then impacted into place and expanded to its full extent.  Pedicles of L4 and L5 were then identified using surface landmarks and intraoperative fluoroscopy.  Superficial bone overlying the pedicle was then removed using a high-speed drill.  Each pedicle was then probed using pedicle all each pedicle all track was then probed and found to be solidly within the bone.  Each pedicle tract was then tapped and then 5.75 mm radius bran screws from [employer] were placed bilaterally at L4 and L5.  Final images reveal good position of the cages and the hardware at the proper operative level with normal alignment of the spine.  Wound is then irrigated one final time.  Transverse processes were decorticated.  Morselized autograft was packed posterior laterally as were allograft packets.  Short segment titanium rod placed over the screw heads at L4 and L5.  Locking caps placed over the screws.  Locking caps and engaged with a construct under compression.  Gelfoam was placed topically over the laminotomy defects.  Vancomycin powder was placed in deep wound space.  Wounds and closed in layers of Vicryl sutures.  Steri-Strips and sterile dressing were applied.  No apparent complications.  Patient tolerated the procedure well and she returns to the recovery room postop.

## 2017-09-06 MED ORDER — DIAZEPAM 5 MG PO TABS: 5.0000 mg | ORAL_TABLET | Freq: Four times a day (QID) | ORAL | 0 refills | Status: DC | PRN

## 2017-09-06 MED ORDER — HYDROCODONE-ACETAMINOPHEN 10-325 MG PO TABS: 1.0000 | ORAL_TABLET | ORAL | 0 refills | Status: DC | PRN

## 2017-09-06 MED FILL — Heparin Sodium (Porcine) Inj 1000 Unit/ML: INTRAMUSCULAR | Qty: 30 | Status: AC

## 2017-09-06 MED FILL — Sodium Chloride IV Soln 0.9%: INTRAVENOUS | Qty: 1000 | Status: AC

## 2017-09-06 MED FILL — Thrombin For Soln 20000 Unit: CUTANEOUS | Qty: 1 | Status: AC

## 2017-09-06 NOTE — Evaluation (Signed)
Physical Therapy Evaluation Patient Details Name: Tracy Rasmussen MRN: [id number] DOB: 1950/07/13 Today's Date: 09/06/2017   History of Present Illness  Pt is a 67 y.o. female s/p B L4-5 re-do decomplressive laminotomies with bilateral L4 and L5 foraminotomies. She has a PMH significant for anxiety disorder, chronic kidney disease, mixed hyperlipidemia, osteopenia, PONV, pulmonary nodules, spondylolisthesis of lumbar region, vitamin D deficiency.   Clinical Impression  Pt admitted with above diagnosis. Pt currently with functional limitations due to the deficits listed below (see PT Problem List). At the time of PT eval pt was able to perform transfers and ambulation with gross supervision to min guard assist for balance support and safety with the RW. Pt was educated on precautions and safe use of RW, however continues to be limited by pain in RLE and decreased tolerance for functional activity. As pt lives alone, recommending HHPT to follow up upon d/c. Pt will benefit from skilled PT to increase their independence and safety with mobility to allow discharge to the venue listed below.       Follow Up Recommendations Home health PT;Supervision for mobility/OOB    Equipment Recommendations  Rolling walker with 5" wheels    Recommendations for Other Services       Precautions / Restrictions Precautions Precautions: Back Precaution Booklet Issued: Yes (comment) Precaution Comments: Pt was cued for precautions during functional mobility.  Required Braces or Orthoses: Spinal Brace Spinal Brace: Lumbar corset;Applied in sitting position Restrictions Weight Bearing Restrictions: No      Mobility  Bed Mobility               General bed mobility comments: OOB in recliner on my arrival. Pt was educated on log roll technique.   Transfers Overall transfer level: Needs assistance Equipment used: Rolling walker (2 wheeled) Transfers: Sit to/from Stand Sit to Stand: Supervision         General transfer comment: VC's for hand placement on seated surface for safety when standing to the RW.   Ambulation/Gait Ambulation/Gait assistance: Supervision Ambulation Distance (Feet): 275 Feet Assistive device: Rolling walker (2 wheeled) Gait Pattern/deviations: Step-through pattern;Decreased stride length;Trunk flexed Gait velocity: Decreased Gait velocity interpretation: 1.31 - 2.62 ft/sec, indicative of limited community ambulator General Gait Details: VC's for improved posture. Pt generally slow and guarded but no overt LOB noted.   Stairs Stairs: Yes Stairs assistance: Min guard Stair Management: One rail Right;With walker;Step to pattern;Forwards Number of Stairs: 10 General stair comments: As pt will need to be able to get her walker up a flight of stairs without assistance, she had walker folded and on her L side - utilized railings on the right. Close guard for safety. Pt required occasional assist for walker placement but overall demonstrated with significant difficulty.   Wheelchair Mobility    Modified Rankin (Stroke Patients Only)       Balance Overall balance assessment: Needs assistance Sitting-balance support: No upper extremity supported;Feet supported Sitting balance-Leahy Scale: Good     Standing balance support: No upper extremity supported;During functional activity;Single extremity supported Standing balance-Leahy Scale: Fair Standing balance comment: Relies on at least single UE support during mobility.                              Pertinent Vitals/Pain Pain Assessment: 0-10 Pain Score: 8  Pain Location: back at incision site and down R leg Pain Descriptors / Indicators: Aching;Operative site guarding;Sore Pain Intervention(s): Monitored during session;Limited activity within patient's  tolerance    Home Living Family/patient expects to be discharged to:: Private residence Living Arrangements: Alone Available Help at  Discharge: Family Type of Home: Apartment(second story) Home Access: Stairs to enter Entrance Stairs-Rails: Right;Left;Can reach both Technical brewer of Steps: 8+8 Home Layout: One level Home Equipment: Cane - single point;Grab bars - tub/shower      Prior Function Level of Independence: Independent with assistive device(s)         Comments: uses cane for functional mobility     Hand Dominance        Extremity/Trunk Assessment   Upper Extremity Assessment Upper Extremity Assessment: Overall WFL for tasks assessed    Lower Extremity Assessment Lower Extremity Assessment: RLE deficits/detail RLE Deficits / Details: Decreased strength consistent with pre-op diagnosis    Cervical / Trunk Assessment Cervical / Trunk Assessment: Other exceptions Cervical / Trunk Exceptions: Forward head posture with rounded shoulders  Communication   Communication: No difficulties  Cognition Arousal/Alertness: Awake/alert Behavior During Therapy: WFL for tasks assessed/performed Overall Cognitive Status: Within Functional Limits for tasks assessed                                        General Comments General comments (skin integrity, edema, etc.): Concerned over pt's lack of assistance available at home.     Exercises     Assessment/Plan    PT Assessment Patient needs continued PT services  PT Problem List Decreased strength;Decreased range of motion;Decreased activity tolerance;Decreased balance;Decreased mobility;Decreased knowledge of use of DME;Decreased safety awareness;Decreased knowledge of precautions;Pain       PT Treatment Interventions DME instruction;Gait training;Stair training;Functional mobility training;Therapeutic activities;Therapeutic exercise;Neuromuscular re-education;Patient/family education    PT Goals (Current goals can be found in the Care Plan section)  Acute Rehab PT Goals Patient Stated Goal: to go home PT Goal Formulation:  With patient Time For Goal Achievement: 09/13/17 Potential to Achieve Goals: Good    Frequency Min 5X/week   Barriers to discharge Decreased caregiver support Will be alone at d/c    Co-evaluation               AM-PAC PT "6 Clicks" Daily Activity  Outcome Measure Difficulty turning over in bed (including adjusting bedclothes, sheets and blankets)?: None Difficulty moving from lying on back to sitting on the side of the bed? : A Little Difficulty sitting down on and standing up from a chair with arms (e.g., wheelchair, bedside commode, etc,.)?: A Little Help needed moving to and from a bed to chair (including a wheelchair)?: A Little Help needed walking in hospital room?: A Little Help needed climbing 3-5 steps with a railing? : A Little 6 Click Score: 19    End of Session Equipment Utilized During Treatment: Gait belt;Back brace Activity Tolerance: Patient tolerated treatment well Patient left: Other (comment)(Pt in bathroom at end of session. RN aware) Nurse Communication: Patient requests pain meds(In bathroom) PT Visit Diagnosis: Unsteadiness on feet (R26.81);Pain;Other symptoms and signs involving the nervous system (R29.898) Pain - Right/Left: Right Pain - part of body: Leg(back)    Time: 7035-0093 PT Time Calculation (min) (ACUTE ONLY): 29 min   Charges:   PT Evaluation $PT Eval Moderate Complexity: 1 Mod PT Treatments $Gait Training: 8-22 mins   PT G Codes:        Rolinda Roan, PT, DPT Acute Rehabilitation Services Pager: [phone]   Thelma Comp 09/06/2017, 10:06 AM

## 2017-09-06 NOTE — Discharge Instructions (Signed)

## 2017-09-06 NOTE — Discharge Summary (Signed)
Physician Discharge Summary  Patient ID: Tracy Rasmussen MRN: [id number] DOB/AGE: 66/18/1952 66 y.o.  Admit date: 09/05/2017 Discharge date: 09/06/2017  Admission Diagnoses:  Discharge Diagnoses:  Active Problems:   Degenerative spondylolisthesis   Discharged Condition: good  Hospital Course: Patient admitted to the hospital where she underwent uncomplicated L3-8 decompression and fusion.  Postoperatively her pain is much improved.  She is ambulating without difficulty.  She is ready for discharge home.  Consults:   Significant Diagnostic Studies:   Treatments:   Discharge Exam: Blood pressure (!) 106/46, pulse (!) 54, temperature 98.3 F (36.8 C), temperature source Oral, resp. rate 16, height 5\' 3"  (1.6 m), weight 73 kg (161 lb), SpO2 95 %. Awake and alert.  Oriented and appropriate.  Cranial nerve function intact.  Motor and sensory function extremities normal.  Wound clean and dry.  Chest and abdomen benign.  Disposition: Discharge disposition: 01-Home or Self Care        Allergies as of 09/06/2017      Reactions   Iodine Hives   Ivp Dye [iodinated Diagnostic Agents] Hives      Medication List    TAKE these medications   acetaminophen 500 MG tablet Commonly known as:  TYLENOL Take 1,000 mg by mouth at bedtime as needed for mild pain, moderate pain, fever or headache.   ALPRAZolam 0.25 MG tablet Commonly known as:  XANAX Take 0.25 mg by mouth at bedtime.   atorvastatin 80 MG tablet Commonly known as:  LIPITOR Take 1 tablet (80 mg total) by mouth daily. What changed:  when to take this   cetirizine 10 MG tablet Commonly known as:  ZYRTEC Take 10 mg by mouth at bedtime.   citalopram 20 MG tablet Commonly known as:  CELEXA Take 20 mg by mouth daily.   diazepam 5 MG tablet Commonly known as:  VALIUM Take 1-2 tablets (5-10 mg total) by mouth every 6 (six) hours as needed for muscle spasms.   fenofibrate 160 MG tablet Take 1 tablet (160 mg total) by  mouth daily. Please make yearly appt with Dr. Johnsie Cancel for February before anymore refills. 1st attempt What changed:    when to take this  additional instructions   Fish Oil 1200 MG Caps Take 3,600 mg by mouth daily after breakfast.   HYDROcodone-acetaminophen 10-325 MG tablet Commonly known as:  NORCO Take 1-2 tablets by mouth every 4 (four) hours as needed for moderate pain ((score 4 to 6)).   Melatonin 3 MG Caps Take 3 mg by mouth at bedtime.   MULTIPLE VITAMIN PO Take 2 tablets by mouth daily after breakfast.   omeprazole 20 MG capsule Commonly known as:  PRILOSEC Take 20 mg by mouth daily.   traMADol 50 MG tablet Commonly known as:  ULTRAM Take 50-100 mg by mouth every 6 (six) hours as needed for moderate pain.   Vitamin D3 5000 units Caps Take 5,000 Units by mouth daily after breakfast.            Durable Medical Equipment  (From admission, onward)        Start     Ordered   09/05/17 1133  DME Walker rolling  Once    Question:  Patient needs a walker to treat with the following condition  Answer:  Degenerative spondylolisthesis   09/05/17 1132   09/05/17 1133  DME 3 n 1  Once     09/05/17 1132       Signed: Mallie Mussel A Keivon Garden 09/06/2017, 7:52 AM

## 2017-09-06 NOTE — Progress Notes (Signed)
Pt doing well. Pt given D/C instructions with Rx's, verbal understanding was provided. Pt's incision is clean and dry with no sign of infection. Pt's IV was removed prior to D/C. Pt D/C'd home via wheelchair @ 1025 per MD order. Pt received RW and 3-n-1 from [place] prior to D/C. Pt is stable @ D/C and has no other needs at this time. Holli Humbles, RN

## 2017-09-06 NOTE — Evaluation (Signed)
Occupational Therapy Evaluation Patient Details Name: Tracy Rasmussen MRN: [id number] DOB: Apr 29, 1950 Today's Date: 09/06/2017    History of Present Illness Pt is a 67 y.o. female s/p B L4-5 re-do decomplressive laminotomies with bilateral L4 and L5 foraminotomies. She has a PMH significant for anxiety disorder, chronic kidney disease, mixed hyperlipidemia, osteopenia, PONV, pulmonary nodules, spondylolisthesis of lumbar region, vitamin D deficiency.    Clinical Impression   PTA, pt was independent with cane for ADL and functional mobility. She currently presents with decreased balance and back pain impacting her ability to participate in ADL tasks at [facility]. Pt requiring min guard assist with cane for toilet transfers and tub-shower transfers as well as supervision for dressing and bathing tasks with cues to adhere to back precautions throughout session. Pt reports that she will have no assistance at home from family or friends. Recommend 3-in-1 for use in shower but pt unsure if she will be able to have assistance to place in shower. She may be able to call apartment complex for assistance. At current functional level, feel that pt will need home health OT follow-up to maximize independence and safety with ADL in her home environment while adhering to back precautions. OT will continue to follow while admitted.     Follow Up Recommendations  Home health OT;Supervision/Assistance - 24 hour(pt with no assistance available from family)    Equipment Recommendations  3 in 1 bedside commode    Recommendations for Other Services       Precautions / Restrictions Precautions Precautions: Back Precaution Booklet Issued: Yes (comment) Precaution Comments: Educated pt concerning back precautions related to ADL.  Required Braces or Orthoses: Spinal Brace Restrictions Weight Bearing Restrictions: No      Mobility Bed Mobility               General bed mobility comments: OOB in recliner on  my arrival  Transfers Overall transfer level: Needs assistance Equipment used: Straight cane Transfers: Sit to/from Stand Sit to Stand: Supervision         General transfer comment: Supervision for general sit<>stand.     Balance Overall balance assessment: Needs assistance Sitting-balance support: No upper extremity supported;Feet supported Sitting balance-Leahy Scale: Good     Standing balance support: No upper extremity supported;During functional activity;Single extremity supported Standing balance-Leahy Scale: Fair Standing balance comment: Relies on at least single UE support during mobility.                            ADL either performed or assessed with clinical judgement   ADL Overall ADL's : Needs assistance/impaired Eating/Feeding: Set up;Sitting   Grooming: Supervision/safety;Standing   Upper Body Bathing: Set up;Sitting   Lower Body Bathing: Supervison/ safety;Sit to/from stand   Upper Body Dressing : Supervision/safety;Sitting(cues for back precautions with bra)   Lower Body Dressing: Supervision/safety;Sit to/from stand   Toilet Transfer: Min guard;Ambulation;Comfort height toilet(with cane)   Toileting- Clothing Manipulation and Hygiene: Supervision/safety;Sit to/from stand;Cueing for back precautions   Tub/ Shower Transfer: 3 in 1;Tub transfer;Min guard(with cane)   Functional mobility during ADLs: Min guard;Cane General ADL Comments: Pt demonstrating some instability on her feet. She reports that she will not be able to have assistance at home for ADL or to bring equipment into her home. Pt educated concerning compensatory strategies for LB ADL and safe tub transfers. Pt requiring min guard assist for safety with tub transfers. Recommend use of 3-in-1 if going to shower and recommend  that pt have someone nearby. However, pt reports no one will be able to assist her to bring 3-in-1 into home. She reports she may do wash-ups until more stable  on feet.      Vision Baseline Vision/History: Wears glasses Wears Glasses: At all times Patient Visual Report: No change from baseline Vision Assessment?: No apparent visual deficits     Perception     Praxis      Pertinent Vitals/Pain Pain Assessment: 0-10 Pain Score: 7  Pain Location: back at incision Pain Descriptors / Indicators: Aching;Operative site guarding;Sore Pain Intervention(s): Limited activity within patient's tolerance;Monitored during session;Repositioned     Hand Dominance     Extremity/Trunk Assessment Upper Extremity Assessment Upper Extremity Assessment: Overall WFL for tasks assessed   Lower Extremity Assessment Lower Extremity Assessment: Defer to PT evaluation       Communication Communication Communication: No difficulties   Cognition Arousal/Alertness: Awake/alert Behavior During Therapy: WFL for tasks assessed/performed Overall Cognitive Status: Within Functional Limits for tasks assessed                                     General Comments  Concerned over pt's lack of assistance available at home.     Exercises     Shoulder Instructions      Home Living Family/patient expects to be discharged to:: Private residence Living Arrangements: Alone Available Help at Discharge: Family Type of Home: Apartment(second story) Home Access: Stairs to enter Technical brewer of Steps: 8+8 Entrance Stairs-Rails: Right;Left;Can reach both Home Layout: One level     Bathroom Shower/Tub: Teacher, early years/pre: Handicapped height Bathroom Accessibility: Yes How Accessible: Accessible via walker Home Equipment: Cane - single point;Grab bars - tub/shower          Prior Functioning/Environment Level of Independence: Independent with assistive device(s)        Comments: uses cane for functional mobility        OT Problem List: Decreased strength;Decreased activity tolerance;Impaired balance (sitting  and/or standing);Decreased safety awareness;Decreased knowledge of use of DME or AE;Decreased knowledge of precautions;Pain      OT Treatment/Interventions: Self-care/ADL training;Therapeutic exercise;Energy conservation;DME and/or AE instruction;Therapeutic activities;Patient/family education;Balance training    OT Goals(Current goals can be found in the care plan section) Acute Rehab OT Goals Patient Stated Goal: to go home OT Goal Formulation: With patient Time For Goal Achievement: 09/20/17 Potential to Achieve Goals: Good ADL Goals Pt Will Perform Grooming: with modified independence;standing Pt Will Perform Upper Body Dressing: with modified independence;sitting Pt Will Perform Lower Body Dressing: with modified independence;sit to/from stand Pt Will Transfer to Toilet: with modified independence;ambulating(handicapped height toilet) Pt Will Perform Toileting - Clothing Manipulation and hygiene: with modified independence;sit to/from stand Pt Will Perform Tub/Shower Transfer: with modified independence;Tub transfer;3 in 1;ambulating;rolling walker(RW vs cane)  OT Frequency: Min 2X/week   Barriers to D/C:            Co-evaluation              AM-PAC PT "6 Clicks" Daily Activity     Outcome Measure Help from another person eating meals?: None Help from another person taking care of personal grooming?: A Little Help from another person toileting, which includes using toliet, bedpan, or urinal?: A Little Help from another person bathing (including washing, rinsing, drying)?: A Little Help from another person to put on and taking off regular upper body clothing?: A  Little Help from another person to put on and taking off regular lower body clothing?: A Little 6 Click Score: 19   End of Session Equipment Utilized During Treatment: Back brace(cane) Nurse Communication: Mobility status;Other (comment)(recommending 3-in-1 but pt unsure if will be able to use)  Activity  Tolerance: Patient tolerated treatment well Patient left: with call bell/phone within reach;in chair  OT Visit Diagnosis: Other abnormalities of gait and mobility (R26.89);Pain Pain - part of body: (back)                Time: 0826-0900 OT Time Calculation (min): 34 min Charges:  OT General Charges $OT Visit: 1 Visit OT Evaluation $OT Eval Moderate Complexity: 1 Mod OT Treatments $Self Care/Home Management : 8-22 mins G-Codes:     Norman Herrlich, MS OTR/L  Pager: [place] A Che[place] Retana 09/06/2017, 9:44 AM

## 2017-10-04 ENCOUNTER — Other Ambulatory Visit: Payer: Self-pay | Admitting: Cardiovascular Disease

## 2017-10-18 DIAGNOSIS — M4316 Spondylolisthesis, lumbar region: Secondary | ICD-10-CM | POA: Diagnosis not present

## 2017-11-29 DIAGNOSIS — M4316 Spondylolisthesis, lumbar region: Secondary | ICD-10-CM | POA: Diagnosis not present

## 2017-12-04 DIAGNOSIS — M6281 Muscle weakness (generalized): Secondary | ICD-10-CM | POA: Diagnosis not present

## 2017-12-04 DIAGNOSIS — M545 Low back pain: Secondary | ICD-10-CM | POA: Diagnosis not present

## 2017-12-04 DIAGNOSIS — M25551 Pain in right hip: Secondary | ICD-10-CM | POA: Diagnosis not present

## 2017-12-04 DIAGNOSIS — R262 Difficulty in walking, not elsewhere classified: Secondary | ICD-10-CM | POA: Diagnosis not present

## 2017-12-05 DIAGNOSIS — Z23 Encounter for immunization: Secondary | ICD-10-CM | POA: Diagnosis not present

## 2017-12-05 DIAGNOSIS — R030 Elevated blood-pressure reading, without diagnosis of hypertension: Secondary | ICD-10-CM | POA: Diagnosis not present

## 2017-12-05 DIAGNOSIS — F419 Anxiety disorder, unspecified: Secondary | ICD-10-CM | POA: Diagnosis not present

## 2017-12-05 DIAGNOSIS — Z6828 Body mass index (BMI) 28.0-28.9, adult: Secondary | ICD-10-CM | POA: Diagnosis not present

## 2017-12-05 DIAGNOSIS — N183 Chronic kidney disease, stage 3 (moderate): Secondary | ICD-10-CM | POA: Diagnosis not present

## 2017-12-05 DIAGNOSIS — E663 Overweight: Secondary | ICD-10-CM | POA: Diagnosis not present

## 2017-12-05 DIAGNOSIS — E782 Mixed hyperlipidemia: Secondary | ICD-10-CM | POA: Diagnosis not present

## 2017-12-05 DIAGNOSIS — R739 Hyperglycemia, unspecified: Secondary | ICD-10-CM | POA: Diagnosis not present

## 2017-12-06 DIAGNOSIS — R262 Difficulty in walking, not elsewhere classified: Secondary | ICD-10-CM | POA: Diagnosis not present

## 2017-12-06 DIAGNOSIS — M545 Low back pain: Secondary | ICD-10-CM | POA: Diagnosis not present

## 2017-12-06 DIAGNOSIS — M25551 Pain in right hip: Secondary | ICD-10-CM | POA: Diagnosis not present

## 2017-12-06 DIAGNOSIS — M6281 Muscle weakness (generalized): Secondary | ICD-10-CM | POA: Diagnosis not present

## 2017-12-07 DIAGNOSIS — M25551 Pain in right hip: Secondary | ICD-10-CM | POA: Diagnosis not present

## 2017-12-07 DIAGNOSIS — M6281 Muscle weakness (generalized): Secondary | ICD-10-CM | POA: Diagnosis not present

## 2017-12-07 DIAGNOSIS — R262 Difficulty in walking, not elsewhere classified: Secondary | ICD-10-CM | POA: Diagnosis not present

## 2017-12-07 DIAGNOSIS — M545 Low back pain: Secondary | ICD-10-CM | POA: Diagnosis not present

## 2017-12-13 ENCOUNTER — Ambulatory Visit (INDEPENDENT_AMBULATORY_CARE_PROVIDER_SITE_OTHER): Payer: PPO | Admitting: Pharmacist

## 2017-12-13 DIAGNOSIS — E782 Mixed hyperlipidemia: Secondary | ICD-10-CM | POA: Diagnosis not present

## 2017-12-13 DIAGNOSIS — R262 Difficulty in walking, not elsewhere classified: Secondary | ICD-10-CM | POA: Diagnosis not present

## 2017-12-13 DIAGNOSIS — M25551 Pain in right hip: Secondary | ICD-10-CM | POA: Diagnosis not present

## 2017-12-13 DIAGNOSIS — M545 Low back pain: Secondary | ICD-10-CM | POA: Diagnosis not present

## 2017-12-13 DIAGNOSIS — M6281 Muscle weakness (generalized): Secondary | ICD-10-CM | POA: Diagnosis not present

## 2017-12-13 MED ORDER — OMEGA-3-ACID ETHYL ESTERS 1 G PO CAPS: 2.0000 g | ORAL_CAPSULE | Freq: Two times a day (BID) | ORAL | 3 refills | Status: DC

## 2017-12-13 NOTE — Progress Notes (Signed)
Patient ID: Tracy Rasmussen                 DOB: Dec 24, 1950                    MRN: [id number]     HPI: Tracy Rasmussen is a 67 y.o. female patient of Dr. Johnsie Cancel that presents today for lipid follow up.  PMH includes hypertriglyceridemia and she has been on cholesterol medications since she was 17.Her mother also had elevated TG but no family history of CAD. She has been maintained on atorvastatin 80mg , fenofibrate 160mg  and fish oil. We have avoided niacin due to history of LFT elevations.   She has had surgery twice in the last few years. She takes Tramadol and still has pain. She fell down steps at home about 3 weeks ago. She did get evaluated by Dr. Sabra Heck and did not reinjure back. She is aware that her diet has not been ideal recently.   She did have labs drawn at PCP recently which revealed - A1c 6.3, and TG 348. She reports compliance with medications.    Risk Factors: age, family history of hypertriglyceridemia LDL goal: <130, non-HDL <160, TG goal < 150  Current Medications: atorvastatin 80mg  daily, fenofibrate 160mg  daily, Fish oil OTC 4000mg  daily, Vit D3 4000 IU Intolerances: simvastatin 40mg  (ineffective)  Diet: She tries to avoid fried foods. She admits to eating junk food, cookies and candy. She states after taking Tramadol she feels very hungry.   Exercise: limited by back pain  Family History: The patient's family history includes Kidney disease in her father; Pancreatic cancer and high triglycerides in her mother; Thyroid disease in her sister. There is no history of Colon cancer, Colon polyps, or Liver disease.   Social History: The patient reports that she has quit smoking. She reports that she drinks alcohol but only socially. She reports that she does not use illicit drugs.  Labs: 12/19/2016: TC 133, TG 303, HDL 31, LDL-d 56 (Lipitor 80mg  daily, fenofibrate 160mg , fish oil 4g daily) 06/07/2016: TC 178, TG 380, HDL 32, LDL 70 (Lipitor 40mg  daily, fenofibrate 160mg   daily, fish oil 4g daily) 08/25/2015: TC 156, TG 324, HDL 30, LDL 61, non-HDL- 126 (Lipitor 40mg , fenofibrate 160mg , fish oil 4 gm) 02/04/2015 (from PCP): TC 214, TG 593, HDL 35, Non-HDL 179  Past Medical History:  Diagnosis Date  . Anxiety disorder   . Chronic kidney disease, stage III (moderate) (HCC)   . Complication of anesthesia   . Mixed hyperlipidemia   . Osteopenia   . Over weight   . PONV (postoperative nausea and vomiting)   . Pulmonary nodules   . Small lymphocytic B-cell lymphoma involving skin ([place])   . Spondylolisthesis of lumbar region   . Vitamin D deficiency     Current Outpatient Medications on File Prior to Visit  Medication Sig Dispense Refill  . acetaminophen (TYLENOL) 500 MG tablet Take 1,000 mg by mouth at bedtime as needed for mild pain, moderate pain, fever or headache.     . ALPRAZolam (XANAX) 0.25 MG tablet Take 0.25 mg by mouth at bedtime.     Marland Kitchen atorvastatin (LIPITOR) 80 MG tablet Take 1 tablet (80 mg total) by mouth daily. (Patient taking differently: Take 80 mg by mouth at bedtime. ) 90 tablet 3  . cetirizine (ZYRTEC) 10 MG tablet Take 10 mg by mouth at bedtime.     . Cholecalciferol (VITAMIN D3) 5000 units CAPS Take 5,000 Units by mouth daily  after breakfast.    . citalopram (CELEXA) 20 MG tablet Take 20 mg by mouth daily.    . diazepam (VALIUM) 5 MG tablet Take 1-2 tablets (5-10 mg total) by mouth every 6 (six) hours as needed for muscle spasms. 30 tablet 0  . fenofibrate 160 MG tablet Take 1 tablet (160 mg total) by mouth daily. 30 tablet 9  . HYDROcodone-acetaminophen (NORCO) 10-325 MG tablet Take 1-2 tablets by mouth every 4 (four) hours as needed for moderate pain ((score 4 to 6)). 50 tablet 0  . Melatonin 3 MG CAPS Take 3 mg by mouth at bedtime.    . MULTIPLE VITAMIN PO Take 2 tablets by mouth daily after breakfast.     . Omega-3 Fatty Acids (FISH OIL) 1200 MG CAPS Take 3,600 mg by mouth daily after breakfast.     . omeprazole (PRILOSEC) 20 MG  capsule Take 20 mg by mouth daily.    . traMADol (ULTRAM) 50 MG tablet Take 50-100 mg by mouth every 6 (six) hours as needed for moderate pain.      No current facility-administered medications on file prior to visit.     Allergies  Allergen Reactions  . Iodine Hives  . Ivp Dye [Iodinated Diagnostic Agents] Hives    Assessment/Plan: Hyperlipidemia: TG not at goal <150. Discussed lifestyle modifications and need to be mindful of these. She will continue to work on watching sweets and believes this will also improve when/if she is able to wean off tramadol. Will change her over the counter fish oil to prescription strength for added benefit. Called to determine copay and Vascepa will be cost prohibitive at $180 per 90 days, but generic Lovaza 4g daily is cost effective at $30 per 90 days. Will repeat lipid panel in 3 months to determine efficacy of prescription strength fish oil.   Thank you,  Lelan Pons. Patterson Hammersmith, [facility]  12/13/2017 7:11 AM

## 2017-12-13 NOTE — Patient Instructions (Addendum)
CONTINUE atorvastatin 80mg  daily, fenofibrate 160mg  daily  STOP over-the-counter fish oil  START prescription strength fish oil (Lovaza) 4g daily  Recheck lipids and hepatic in 3 months

## 2017-12-14 DIAGNOSIS — R262 Difficulty in walking, not elsewhere classified: Secondary | ICD-10-CM | POA: Diagnosis not present

## 2017-12-14 DIAGNOSIS — M6281 Muscle weakness (generalized): Secondary | ICD-10-CM | POA: Diagnosis not present

## 2017-12-14 DIAGNOSIS — M545 Low back pain: Secondary | ICD-10-CM | POA: Diagnosis not present

## 2017-12-14 DIAGNOSIS — M25551 Pain in right hip: Secondary | ICD-10-CM | POA: Diagnosis not present

## 2017-12-18 DIAGNOSIS — M545 Low back pain: Secondary | ICD-10-CM | POA: Diagnosis not present

## 2017-12-18 DIAGNOSIS — M6281 Muscle weakness (generalized): Secondary | ICD-10-CM | POA: Diagnosis not present

## 2017-12-18 DIAGNOSIS — R262 Difficulty in walking, not elsewhere classified: Secondary | ICD-10-CM | POA: Diagnosis not present

## 2017-12-18 DIAGNOSIS — M25551 Pain in right hip: Secondary | ICD-10-CM | POA: Diagnosis not present

## 2017-12-20 DIAGNOSIS — M25551 Pain in right hip: Secondary | ICD-10-CM | POA: Diagnosis not present

## 2017-12-20 DIAGNOSIS — M545 Low back pain: Secondary | ICD-10-CM | POA: Diagnosis not present

## 2017-12-20 DIAGNOSIS — R262 Difficulty in walking, not elsewhere classified: Secondary | ICD-10-CM | POA: Diagnosis not present

## 2017-12-20 DIAGNOSIS — M6281 Muscle weakness (generalized): Secondary | ICD-10-CM | POA: Diagnosis not present

## 2017-12-21 DIAGNOSIS — M25551 Pain in right hip: Secondary | ICD-10-CM | POA: Diagnosis not present

## 2017-12-21 DIAGNOSIS — M545 Low back pain: Secondary | ICD-10-CM | POA: Diagnosis not present

## 2017-12-21 DIAGNOSIS — M6281 Muscle weakness (generalized): Secondary | ICD-10-CM | POA: Diagnosis not present

## 2017-12-21 DIAGNOSIS — R262 Difficulty in walking, not elsewhere classified: Secondary | ICD-10-CM | POA: Diagnosis not present

## 2017-12-25 DIAGNOSIS — M545 Low back pain: Secondary | ICD-10-CM | POA: Diagnosis not present

## 2017-12-25 DIAGNOSIS — R262 Difficulty in walking, not elsewhere classified: Secondary | ICD-10-CM | POA: Diagnosis not present

## 2017-12-25 DIAGNOSIS — M25551 Pain in right hip: Secondary | ICD-10-CM | POA: Diagnosis not present

## 2017-12-25 DIAGNOSIS — M6281 Muscle weakness (generalized): Secondary | ICD-10-CM | POA: Diagnosis not present

## 2017-12-27 DIAGNOSIS — M4316 Spondylolisthesis, lumbar region: Secondary | ICD-10-CM | POA: Diagnosis not present

## 2017-12-28 DIAGNOSIS — M25572 Pain in left ankle and joints of left foot: Secondary | ICD-10-CM | POA: Diagnosis not present

## 2017-12-28 DIAGNOSIS — M6281 Muscle weakness (generalized): Secondary | ICD-10-CM | POA: Diagnosis not present

## 2017-12-28 DIAGNOSIS — M25551 Pain in right hip: Secondary | ICD-10-CM | POA: Diagnosis not present

## 2017-12-28 DIAGNOSIS — M545 Low back pain: Secondary | ICD-10-CM | POA: Diagnosis not present

## 2017-12-28 DIAGNOSIS — R262 Difficulty in walking, not elsewhere classified: Secondary | ICD-10-CM | POA: Diagnosis not present

## 2018-01-01 DIAGNOSIS — M6281 Muscle weakness (generalized): Secondary | ICD-10-CM | POA: Diagnosis not present

## 2018-01-01 DIAGNOSIS — M25551 Pain in right hip: Secondary | ICD-10-CM | POA: Diagnosis not present

## 2018-01-01 DIAGNOSIS — R262 Difficulty in walking, not elsewhere classified: Secondary | ICD-10-CM | POA: Diagnosis not present

## 2018-01-01 DIAGNOSIS — M545 Low back pain: Secondary | ICD-10-CM | POA: Diagnosis not present

## 2018-01-03 DIAGNOSIS — M545 Low back pain: Secondary | ICD-10-CM | POA: Diagnosis not present

## 2018-01-03 DIAGNOSIS — M6281 Muscle weakness (generalized): Secondary | ICD-10-CM | POA: Diagnosis not present

## 2018-01-03 DIAGNOSIS — R262 Difficulty in walking, not elsewhere classified: Secondary | ICD-10-CM | POA: Diagnosis not present

## 2018-01-03 DIAGNOSIS — M25551 Pain in right hip: Secondary | ICD-10-CM | POA: Diagnosis not present

## 2018-01-06 DIAGNOSIS — H524 Presbyopia: Secondary | ICD-10-CM | POA: Diagnosis not present

## 2018-01-08 DIAGNOSIS — M6281 Muscle weakness (generalized): Secondary | ICD-10-CM | POA: Diagnosis not present

## 2018-01-08 DIAGNOSIS — M25551 Pain in right hip: Secondary | ICD-10-CM | POA: Diagnosis not present

## 2018-01-08 DIAGNOSIS — M545 Low back pain: Secondary | ICD-10-CM | POA: Diagnosis not present

## 2018-01-08 DIAGNOSIS — R262 Difficulty in walking, not elsewhere classified: Secondary | ICD-10-CM | POA: Diagnosis not present

## 2018-01-10 DIAGNOSIS — M545 Low back pain: Secondary | ICD-10-CM | POA: Diagnosis not present

## 2018-01-10 DIAGNOSIS — M6281 Muscle weakness (generalized): Secondary | ICD-10-CM | POA: Diagnosis not present

## 2018-01-10 DIAGNOSIS — M25551 Pain in right hip: Secondary | ICD-10-CM | POA: Diagnosis not present

## 2018-01-10 DIAGNOSIS — R262 Difficulty in walking, not elsewhere classified: Secondary | ICD-10-CM | POA: Diagnosis not present

## 2018-01-15 DIAGNOSIS — M545 Low back pain: Secondary | ICD-10-CM | POA: Diagnosis not present

## 2018-01-15 DIAGNOSIS — M6281 Muscle weakness (generalized): Secondary | ICD-10-CM | POA: Diagnosis not present

## 2018-01-15 DIAGNOSIS — R262 Difficulty in walking, not elsewhere classified: Secondary | ICD-10-CM | POA: Diagnosis not present

## 2018-01-15 DIAGNOSIS — M25551 Pain in right hip: Secondary | ICD-10-CM | POA: Diagnosis not present

## 2018-01-17 DIAGNOSIS — M545 Low back pain: Secondary | ICD-10-CM | POA: Diagnosis not present

## 2018-01-17 DIAGNOSIS — M25551 Pain in right hip: Secondary | ICD-10-CM | POA: Diagnosis not present

## 2018-01-17 DIAGNOSIS — R262 Difficulty in walking, not elsewhere classified: Secondary | ICD-10-CM | POA: Diagnosis not present

## 2018-01-17 DIAGNOSIS — M6281 Muscle weakness (generalized): Secondary | ICD-10-CM | POA: Diagnosis not present

## 2018-01-30 DIAGNOSIS — M25572 Pain in left ankle and joints of left foot: Secondary | ICD-10-CM | POA: Diagnosis not present

## 2018-02-05 DIAGNOSIS — C8519 Unspecified B-cell lymphoma, extranodal and solid organ sites: Secondary | ICD-10-CM | POA: Diagnosis not present

## 2018-02-08 DIAGNOSIS — M4316 Spondylolisthesis, lumbar region: Secondary | ICD-10-CM | POA: Diagnosis not present

## 2018-02-14 ENCOUNTER — Other Ambulatory Visit: Payer: Self-pay | Admitting: Neurosurgery

## 2018-02-14 DIAGNOSIS — M4316 Spondylolisthesis, lumbar region: Secondary | ICD-10-CM

## 2018-02-27 DIAGNOSIS — M25572 Pain in left ankle and joints of left foot: Secondary | ICD-10-CM | POA: Diagnosis not present

## 2018-03-05 ENCOUNTER — Ambulatory Visit
Admission: RE | Admit: 2018-03-05 | Discharge: 2018-03-05 | Disposition: A | Discharge status: Home / Self Care | Payer: PPO | Source: Ambulatory Visit | Attending: Neurosurgery | Admitting: Neurosurgery

## 2018-03-05 DIAGNOSIS — M48061 Spinal stenosis, lumbar region without neurogenic claudication: Secondary | ICD-10-CM | POA: Diagnosis not present

## 2018-03-05 DIAGNOSIS — M4316 Spondylolisthesis, lumbar region: Secondary | ICD-10-CM

## 2018-03-05 MED ORDER — GADOBENATE DIMEGLUMINE 529 MG/ML IV SOLN
15.0000 mL | Freq: Once | INTRAVENOUS | Status: AC | PRN
  Administered 2018-03-05: 15 mL via INTRAVENOUS

## 2018-03-06 DIAGNOSIS — R030 Elevated blood-pressure reading, without diagnosis of hypertension: Secondary | ICD-10-CM | POA: Diagnosis not present

## 2018-03-06 DIAGNOSIS — M4316 Spondylolisthesis, lumbar region: Secondary | ICD-10-CM | POA: Diagnosis not present

## 2018-03-07 ENCOUNTER — Other Ambulatory Visit: Payer: PPO | Admitting: *Deleted

## 2018-03-07 DIAGNOSIS — E782 Mixed hyperlipidemia: Secondary | ICD-10-CM

## 2018-03-07 LAB — HEPATIC FUNCTION PANEL
ALK PHOS: 93 IU/L (ref 39–117)
ALT: 26 IU/L (ref 0–32)
AST: 29 IU/L (ref 0–40)
Albumin: 4.2 g/dL (ref 3.6–4.8)
BILIRUBIN TOTAL: 0.3 mg/dL (ref 0.0–1.2)
BILIRUBIN, DIRECT: 0.11 mg/dL (ref 0.00–0.40)
Total Protein: 6.8 g/dL (ref 6.0–8.5)

## 2018-03-07 LAB — LIPID PANEL
Chol/HDL Ratio: 4.3 ratio (ref 0.0–4.4)
Cholesterol, Total: 156 mg/dL (ref 100–199)
HDL: 36 mg/dL — ABNORMAL LOW (ref >39)
LDL Calculated: 54 mg/dL (ref 0–99)
TRIGLYCERIDES: 328 mg/dL — AB (ref 0–149)
VLDL Cholesterol Cal: 66 mg/dL — ABNORMAL HIGH (ref 5–40)

## 2018-03-14 ENCOUNTER — Other Ambulatory Visit: Payer: PPO

## 2018-03-24 DIAGNOSIS — M1611 Unilateral primary osteoarthritis, right hip: Secondary | ICD-10-CM | POA: Diagnosis not present

## 2018-03-26 ENCOUNTER — Telehealth: Payer: Self-pay | Admitting: Cardiovascular Disease

## 2018-03-26 DIAGNOSIS — M25572 Pain in left ankle and joints of left foot: Secondary | ICD-10-CM | POA: Diagnosis not present

## 2018-03-26 NOTE — Telephone Encounter (Signed)
° ° °  Please return call to patient with lab results 

## 2018-03-26 NOTE — Telephone Encounter (Signed)
Left message for patient to call back  

## 2018-03-27 ENCOUNTER — Telehealth: Payer: Self-pay | Admitting: Cardiovascular Disease

## 2018-03-27 NOTE — Telephone Encounter (Signed)
lpmtcb 12/17

## 2018-03-27 NOTE — Telephone Encounter (Signed)
New Message ° ° ° ° ° °Patient returned your call, pls call again. °

## 2018-03-27 NOTE — Telephone Encounter (Signed)
Spoke to patient with results/recommendations per Dr Johnsie Cancel and pharmacy.  The patient said that she is well aware of her lab values over sometime now, but she is going through a lot recently. She will eventually come around with better eating habits, but is having difficulty right now.  She verbalized understanding and was very thankful for the call.

## 2018-04-02 NOTE — Telephone Encounter (Signed)
Patient aware of results.

## 2018-04-19 DIAGNOSIS — M4316 Spondylolisthesis, lumbar region: Secondary | ICD-10-CM | POA: Diagnosis not present

## 2018-04-23 ENCOUNTER — Other Ambulatory Visit: Payer: Self-pay | Admitting: Orthopaedic Surgery

## 2018-04-27 NOTE — Pre-Procedure Instructions (Signed)
Lenita Peregrina  04/27/2018      [place] [address], [place] [address] Phone: [phone] Fax: [phone]  [place], [place] [facility] [address] [place] [address] [place] [address] Phone: [phone] Fax: [phone]    Your procedure is scheduled on Tuesday January 28th.  Report to [facility] Admitting at 1:15 P.M.  Call this number if you have problems the morning of surgery:  [phone]   Remember:  Do not eat or drink after midnight.    Take these medicines the morning of surgery with A SIP OF WATER  acetaminophen (TYLENOL) if needed citalopram (CELEXA)  fenofibrate  omeprazole (PRILOSEC)  traMADol (ULTRAM)  If needed  7 days prior to surgery STOP taking any Aspirin (unless otherwise instructed by your surgeon), Aleve, Naproxen, Ibuprofen, Motrin, Advil, Goody's, BC's, all herbal medications, fish oil, and all vitamins.     Do not wear jewelry, make-up or nail polish.  Do not wear lotions, powders, or perfumes, or deodorant.  Do not shave 48 hours prior to surgery.  Men may shave face and neck.  Do not bring valuables to the hospital.  [facility] is not responsible for any belongings or valuables.  Contacts, dentures or bridgework may not be worn into surgery.  Leave your suitcase in the car.  After surgery it may be brought to your room.  For patients admitted to the hospital, discharge time will be determined by your treatment team.  Patients discharged the day of surgery will not be allowed to drive home.    [place]- Preparing For Surgery  Before surgery, you can play an important role. Because skin is not sterile, your skin needs to be as free of germs as possible. You can reduce the number of germs on your skin by washing with CHG (chlorahexidine gluconate) Soap before surgery.  CHG is an antiseptic cleaner which  kills germs and bonds with the skin to continue killing germs even after washing.    Oral Hygiene is also important to reduce your risk of infection.  Remember - BRUSH YOUR TEETH THE MORNING OF SURGERY WITH YOUR REGULAR TOOTHPASTE  Please do not use if you have an allergy to CHG or antibacterial soaps. If your skin becomes reddened/irritated stop using the CHG.  Do not shave (including legs and underarms) for at least 48 hours prior to first CHG shower. It is OK to shave your face.  Please follow these instructions carefully.   1. Shower the NIGHT BEFORE SURGERY and the MORNING OF SURGERY with CHG.   2. If you chose to wash your hair, wash your hair first as usual with your normal shampoo.  3. After you shampoo, rinse your hair and body thoroughly to remove the shampoo.  4. Use CHG as you would any other liquid soap. You can apply CHG directly to the skin and wash gently with a scrungie or a clean washcloth.   5. Apply the CHG Soap to your body ONLY FROM THE NECK DOWN.  Do not use on open wounds or open sores. Avoid contact with your eyes, ears, mouth and genitals (private parts). Wash Face and genitals (private parts)  with your normal soap.  6. Wash thoroughly, paying special attention to the area where your surgery will be performed.  7. Thoroughly rinse your body with warm water from the neck down.  8.  DO NOT shower/wash with your normal soap after using and rinsing off the CHG Soap.  9. Pat yourself dry with a CLEAN TOWEL.  10. Wear CLEAN PAJAMAS to bed the night before surgery, wear comfortable clothes the morning of surgery  11. Place CLEAN SHEETS on your bed the night of your first shower and DO NOT SLEEP WITH PETS.    Day of Surgery:  Do not apply any deodorants/lotions.  Please wear clean clothes to the hospital/surgery center.   Remember to brush your teeth WITH YOUR REGULAR TOOTHPASTE.    Please read over the following fact sheets that you were  given.

## 2018-04-30 ENCOUNTER — Encounter (HOSPITAL_COMMUNITY)
Admission: RE | Admit: 2018-04-30 | Discharge: 2018-04-30 | Disposition: A | Discharge status: Home / Self Care | Payer: PPO | Source: Ambulatory Visit | Attending: Orthopaedic Surgery | Admitting: Orthopaedic Surgery

## 2018-04-30 ENCOUNTER — Encounter (HOSPITAL_COMMUNITY): Payer: Self-pay

## 2018-04-30 ENCOUNTER — Ambulatory Visit (HOSPITAL_COMMUNITY)
Admission: RE | Admit: 2018-04-30 | Discharge: 2018-04-30 | Disposition: A | Discharge status: Home / Self Care | Payer: PPO | Source: Ambulatory Visit | Attending: Orthopaedic Surgery | Admitting: Orthopaedic Surgery

## 2018-04-30 ENCOUNTER — Other Ambulatory Visit: Payer: Self-pay

## 2018-04-30 DIAGNOSIS — Z01812 Encounter for preprocedural laboratory examination: Secondary | ICD-10-CM | POA: Insufficient documentation

## 2018-04-30 DIAGNOSIS — Z01818 Encounter for other preprocedural examination: Secondary | ICD-10-CM | POA: Diagnosis not present

## 2018-04-30 LAB — URINALYSIS, ROUTINE W REFLEX MICROSCOPIC
Bilirubin Urine: NEGATIVE
Glucose, UA: NEGATIVE mg/dL
Ketones, ur: NEGATIVE mg/dL
Leukocytes, UA: NEGATIVE
Nitrite: NEGATIVE
Protein, ur: 30 mg/dL — AB
Specific Gravity, Urine: 1.018 (ref 1.005–1.030)
pH: 5 (ref 5.0–8.0)

## 2018-04-30 LAB — CBC WITH DIFFERENTIAL/PLATELET
Abs Immature Granulocytes: 0.07 10*3/uL (ref 0.00–0.07)
BASOS ABS: 0.1 10*3/uL (ref 0.0–0.1)
Basophils Relative: 1 %
Eosinophils Absolute: 0.2 10*3/uL (ref 0.0–0.5)
Eosinophils Relative: 2 %
HCT: 46.4 % — ABNORMAL HIGH (ref 36.0–46.0)
Hemoglobin: 14 g/dL (ref 12.0–15.0)
Immature Granulocytes: 1 %
LYMPHS PCT: 29 %
Lymphs Abs: 4.1 10*3/uL — ABNORMAL HIGH (ref 0.7–4.0)
MCH: 26.6 pg (ref 26.0–34.0)
MCHC: 30.2 g/dL (ref 30.0–36.0)
MCV: 88 fL (ref 80.0–100.0)
Monocytes Absolute: 0.8 10*3/uL (ref 0.1–1.0)
Monocytes Relative: 6 %
NEUTROS ABS: 8.8 10*3/uL — AB (ref 1.7–7.7)
Neutrophils Relative %: 61 %
Platelets: 378 10*3/uL (ref 150–400)
RBC: 5.27 MIL/uL — ABNORMAL HIGH (ref 3.87–5.11)
RDW: 14.5 % (ref 11.5–15.5)
WBC: 14 10*3/uL — ABNORMAL HIGH (ref 4.0–10.5)
nRBC: 0 % (ref 0.0–0.2)

## 2018-04-30 LAB — BASIC METABOLIC PANEL
Anion gap: 13 (ref 5–15)
BUN: 20 mg/dL (ref 8–23)
CHLORIDE: 106 mmol/L (ref 98–111)
CO2: 21 mmol/L — ABNORMAL LOW (ref 22–32)
Calcium: 9.9 mg/dL (ref 8.9–10.3)
Creatinine, Ser: 0.96 mg/dL (ref 0.44–1.00)
GFR calc Af Amer: >60 mL/min (ref >60)
GFR calc non Af Amer: >60 mL/min (ref >60)
Glucose, Bld: 93 mg/dL (ref 70–99)
POTASSIUM: 3.9 mmol/L (ref 3.5–5.1)
Sodium: 140 mmol/L (ref 135–145)

## 2018-04-30 LAB — TYPE AND SCREEN
ABO/RH(D): B POS
Antibody Screen: NEGATIVE

## 2018-04-30 LAB — SURGICAL PCR SCREEN
MRSA, PCR: NEGATIVE
Staphylococcus aureus: NEGATIVE

## 2018-04-30 LAB — PROTIME-INR
INR: 1.01
Prothrombin Time: 13.2 seconds (ref 11.4–15.2)

## 2018-04-30 LAB — APTT: aPTT: 37 seconds — ABNORMAL HIGH (ref 24–36)

## 2018-04-30 NOTE — Progress Notes (Signed)
PCP: Kathyrn Lass  DM: denies  SA: denies  Pt denies SOB, cough, fever, chest pain at PAT appt.  Pt stated understanding of instructions given for DOS.

## 2018-05-03 ENCOUNTER — Other Ambulatory Visit: Payer: Self-pay | Admitting: Orthopaedic Surgery

## 2018-05-03 NOTE — H&P (Signed)
TOTAL HIP ADMISSION H&P  Patient is admitted for right total hip arthroplasty.  Subjective:  Chief Complaint: right hip pain  HPI: Tracy Rasmussen, 68 y.o. female, has a history of pain and functional disability in the right hip(s) due to arthritis and patient has failed non-surgical conservative treatments for greater than 12 weeks to include NSAID's and/or analgesics, corticosteriod injections, flexibility and strengthening excercises, use of assistive devices, weight reduction as appropriate and activity modification.  Onset of symptoms was gradual starting 5 years ago with gradually worsening course since that time.The patient noted no past surgery on the right hip(s).  Patient currently rates pain in the right hip at 10 out of 10 with activity. Patient has night pain, worsening of pain with activity and weight bearing, trendelenberg gait, pain that interfers with activities of daily living and crepitus. Patient has evidence of subchondral cysts, subchondral sclerosis, periarticular osteophytes and joint space narrowing by imaging studies. This condition presents safety issues increasing the risk of falls.  There is no current active infection.  Patient Active Problem List   Diagnosis Date Noted  . Degenerative spondylolisthesis 09/05/2017  . Elevated cholesterol with elevated triglycerides 05/29/2015   Past Medical History:  Diagnosis Date  . Anxiety disorder   . Complication of anesthesia   . Mixed hyperlipidemia   . Osteopenia   . Over weight   . PONV (postoperative nausea and vomiting)   . Pulmonary nodules   . Small lymphocytic B-cell lymphoma involving skin ([place])   . Spondylolisthesis of lumbar region   . Vitamin D deficiency     Past Surgical History:  Procedure Laterality Date  . BACK SURGERY  1994   L5, S1 herniated disc  . BACK SURGERY     Herniated Disc 2018  . BACK SURGERY     Fusion L4 & L5, Herniated Disc 2019  . BREAST BIOPSY  2006   benign  . CRYOTHERAPY   1980's  . WISDOM TOOTH EXTRACTION     teenage years    No current facility-administered medications for this encounter.    Current Outpatient Medications  Medication Sig Dispense Refill Last Dose  . acetaminophen (TYLENOL) 500 MG tablet Take 1,000 mg by mouth at bedtime as needed for mild pain, moderate pain, fever or headache.    09/05/2017 at 0330  . ALPRAZolam (XANAX) 0.25 MG tablet Take 0.25 mg by mouth at bedtime.    09/04/2017  . atorvastatin (LIPITOR) 80 MG tablet Take 1 tablet (80 mg total) by mouth daily. (Patient taking differently: Take 80 mg by mouth at bedtime. ) 90 tablet 3 09/04/2017 at Unknown time  . Calcium-Vitamin D-Vitamin K [id number] MG-UNT-MCG CHEW Chew 2 each by mouth every morning.     . cetirizine (ZYRTEC) 10 MG tablet Take 10 mg by mouth at bedtime.    09/04/2017 at Unknown time  . Cholecalciferol (VITAMIN D3) 5000 units CAPS Take 5,000 Units by mouth daily after breakfast.   09/04/2017 at Unknown time  . citalopram (CELEXA) 20 MG tablet Take 20 mg by mouth daily.   09/05/2017 at 0330  . fenofibrate 160 MG tablet Take 1 tablet (160 mg total) by mouth daily. 30 tablet 9   . Melatonin 3 MG CAPS Take 3 mg by mouth at bedtime.   08/30/2017  . MULTIPLE VITAMIN PO Take 2 tablets by mouth daily after breakfast.    08/30/2017  . omega-3 acid ethyl esters (LOVAZA) 1 g capsule Take 2 capsules (2 g total) by mouth 2 (two) times  daily. (Patient taking differently: Take 4 g by mouth daily. ) 360 capsule 3   . omeprazole (PRILOSEC) 20 MG capsule Take 20 mg by mouth every morning.    09/05/2017 at 0330  . traMADol (ULTRAM) 50 MG tablet Take 50-100 mg by mouth every 6 (six) hours as needed for moderate pain.    09/05/2017 at 0330   Allergies  Allergen Reactions  . Iodine Hives  . Ivp Dye [Iodinated Diagnostic Agents] Hives    Social History   Tobacco Use  . Smoking status: Former Research scientist (life sciences)  . Smokeless tobacco: Former Systems developer  . Tobacco comment: quit 2005  Substance Use Topics  . Alcohol  use: Yes    Alcohol/week: 0.0 standard drinks    Comment: social    Family History  Problem Relation Age of Onset  . Pancreatic cancer Mother   . Kidney disease Father   . Thyroid disease Sister   . Colon cancer Neg Hx   . Colon polyps Neg Hx   . Liver disease Neg Hx      Review of Systems  Musculoskeletal: Positive for joint pain.       Right hip  All other systems reviewed and are negative.   Objective:  Physical Exam  Constitutional: She is oriented to person, place, and time. She appears well-developed and well-nourished.  HENT:  Head: Normocephalic and atraumatic.  Eyes: Pupils are equal, round, and reactive to light.  Neck: Normal range of motion.  Cardiovascular: Normal rate and regular rhythm.  Respiratory: Effort normal.  GI: Soft.  Musculoskeletal:     Comments: Right hip motion is limited and quite painful.  This leg might be slightly short compared to the opposite side.  She walks with a markedly altered gait.  Opposite hip moves well.  Sensation and motor function are intact distally with palpable pulses in her feet.   Neurological: She is alert and oriented to person, place, and time.  Skin: Skin is warm and dry.  Psychiatric: She has a normal mood and affect. Her behavior is normal. Judgment and thought content normal.    Vital signs in last 24 hours:    Labs:   Estimated body mass index is 27.34 kg/m as calculated from the following:   Height as of 04/30/18: 5\' 4"  (1.626 m).   Weight as of 04/30/18: 72.3 kg.   Imaging Review Plain radiographs demonstrate severe degenerative joint disease of the right hip(s). The bone quality appears to be good for age and reported activity level.    Preoperative templating of the joint replacement has been completed, documented, and submitted to the Operating Room personnel in order to optimize intra-operative equipment management.     Assessment/Plan:  End stage primary arthritis, right hip(s)  The  patient history, physical examination, clinical judgement of the provider and imaging studies are consistent with end stage degenerative joint disease of the right hip(s) and total hip arthroplasty is deemed medically necessary. The treatment options including medical management, injection therapy, arthroscopy and arthroplasty were discussed at length. The risks and benefits of total hip arthroplasty were presented and reviewed. The risks due to aseptic loosening, infection, stiffness, dislocation/subluxation,  thromboembolic complications and other imponderables were discussed.  The patient acknowledged the explanation, agreed to proceed with the plan and consent was signed. Patient is being admitted for inpatient treatment for surgery, pain control, PT, OT, prophylactic antibiotics, VTE prophylaxis, progressive ambulation and ADL's and discharge planning.The patient is planning to be discharged home with home health services

## 2018-05-07 MED ORDER — TRANEXAMIC ACID 1000 MG/10ML IV SOLN
2000.0000 mg | INTRAVENOUS | Status: AC
  Administered 2018-05-08: 2000 mg via TOPICAL
  Filled 2018-05-07 (×2): qty 20

## 2018-05-07 MED ORDER — BUPIVACAINE LIPOSOME 1.3 % IJ SUSP
10.0000 mL | INTRAMUSCULAR | Status: AC
  Administered 2018-05-08: 10 mL
  Filled 2018-05-07: qty 10

## 2018-05-08 ENCOUNTER — Encounter (HOSPITAL_COMMUNITY)
Admission: RE | Disposition: A | Discharge status: Home Health | Payer: Self-pay | Source: Home / Self Care | Attending: Orthopaedic Surgery

## 2018-05-08 ENCOUNTER — Inpatient Hospital Stay (HOSPITAL_COMMUNITY): Payer: PPO

## 2018-05-08 ENCOUNTER — Inpatient Hospital Stay (HOSPITAL_COMMUNITY): Payer: PPO | Admitting: Anesthesiology

## 2018-05-08 ENCOUNTER — Encounter (HOSPITAL_COMMUNITY): Payer: Self-pay | Admitting: *Deleted

## 2018-05-08 ENCOUNTER — Other Ambulatory Visit: Payer: Self-pay

## 2018-05-08 ENCOUNTER — Inpatient Hospital Stay (HOSPITAL_COMMUNITY)
Admission: RE | Admit: 2018-05-08 | Discharge: 2018-05-10 | DRG: 470 | Disposition: A | Discharge status: Home Health | Payer: PPO | Attending: Orthopaedic Surgery | Admitting: Orthopaedic Surgery

## 2018-05-08 DIAGNOSIS — Z87891 Personal history of nicotine dependence: Secondary | ICD-10-CM

## 2018-05-08 DIAGNOSIS — M1611 Unilateral primary osteoarthritis, right hip: Principal | ICD-10-CM

## 2018-05-08 DIAGNOSIS — E782 Mixed hyperlipidemia: Secondary | ICD-10-CM | POA: Diagnosis not present

## 2018-05-08 DIAGNOSIS — M25551 Pain in right hip: Secondary | ICD-10-CM | POA: Diagnosis present

## 2018-05-08 DIAGNOSIS — M4316 Spondylolisthesis, lumbar region: Secondary | ICD-10-CM | POA: Diagnosis not present

## 2018-05-08 DIAGNOSIS — Z419 Encounter for procedure for purposes other than remedying health state, unspecified: Secondary | ICD-10-CM

## 2018-05-08 DIAGNOSIS — Z8572 Personal history of non-Hodgkin lymphomas: Secondary | ICD-10-CM

## 2018-05-08 DIAGNOSIS — M8580 Other specified disorders of bone density and structure, unspecified site: Secondary | ICD-10-CM | POA: Diagnosis present

## 2018-05-08 DIAGNOSIS — Z471 Aftercare following joint replacement surgery: Secondary | ICD-10-CM | POA: Diagnosis not present

## 2018-05-08 DIAGNOSIS — Z96641 Presence of right artificial hip joint: Secondary | ICD-10-CM | POA: Diagnosis not present

## 2018-05-08 DIAGNOSIS — F419 Anxiety disorder, unspecified: Secondary | ICD-10-CM | POA: Diagnosis not present

## 2018-05-08 DIAGNOSIS — Z79899 Other long term (current) drug therapy: Secondary | ICD-10-CM

## 2018-05-08 DIAGNOSIS — Z91041 Radiographic dye allergy status: Secondary | ICD-10-CM | POA: Diagnosis not present

## 2018-05-08 HISTORY — PX: TOTAL HIP ARTHROPLASTY: SHX124

## 2018-05-08 LAB — CBC
HCT: 41.5 % (ref 36.0–46.0)
Hemoglobin: 12.5 g/dL (ref 12.0–15.0)
MCH: 26.4 pg (ref 26.0–34.0)
MCHC: 30.1 g/dL (ref 30.0–36.0)
MCV: 87.7 fL (ref 80.0–100.0)
Platelets: 290 10*3/uL (ref 150–400)
RBC: 4.73 MIL/uL (ref 3.87–5.11)
RDW: 14.4 % (ref 11.5–15.5)
WBC: 8.9 10*3/uL (ref 4.0–10.5)
nRBC: 0 % (ref 0.0–0.2)

## 2018-05-08 SURGERY — ARTHROPLASTY, HIP, TOTAL, ANTERIOR APPROACH
Anesthesia: Spinal | Site: Hip | Laterality: Right

## 2018-05-08 MED ORDER — CEFAZOLIN SODIUM-DEXTROSE 2-4 GM/100ML-% IV SOLN
INTRAVENOUS | Status: AC
  Filled 2018-05-08: qty 100

## 2018-05-08 MED ORDER — ALPRAZOLAM 0.25 MG PO TABS
0.2500 mg | ORAL_TABLET | Freq: Every day | ORAL | Status: DC
  Administered 2018-05-08 – 2018-05-09 (×2): 0.25 mg via ORAL
  Filled 2018-05-08 (×2): qty 1

## 2018-05-08 MED ORDER — FENTANYL CITRATE (PF) 250 MCG/5ML IJ SOLN
INTRAMUSCULAR | Status: AC
  Filled 2018-05-08: qty 5

## 2018-05-08 MED ORDER — HYDROCODONE-ACETAMINOPHEN 5-325 MG PO TABS
1.0000 | ORAL_TABLET | ORAL | Status: DC | PRN
  Administered 2018-05-08: 2 via ORAL

## 2018-05-08 MED ORDER — FENTANYL CITRATE (PF) 100 MCG/2ML IJ SOLN
INTRAMUSCULAR | Status: DC | PRN
  Administered 2018-05-08 (×3): 50 ug via INTRAVENOUS
  Administered 2018-05-08: 100 ug via INTRAVENOUS
  Administered 2018-05-08 (×5): 50 ug via INTRAVENOUS

## 2018-05-08 MED ORDER — OMEGA-3-ACID ETHYL ESTERS 1 G PO CAPS
4.0000 g | ORAL_CAPSULE | Freq: Every day | ORAL | Status: DC
  Administered 2018-05-09 – 2018-05-10 (×2): 4 g via ORAL
  Filled 2018-05-08 (×3): qty 4

## 2018-05-08 MED ORDER — ALUM & MAG HYDROXIDE-SIMETH 200-200-20 MG/5ML PO SUSP: 30.0000 mL | ORAL | Status: DC | PRN

## 2018-05-08 MED ORDER — PANTOPRAZOLE SODIUM 40 MG PO TBEC
40.0000 mg | DELAYED_RELEASE_TABLET | Freq: Every day | ORAL | Status: DC
  Administered 2018-05-09 – 2018-05-10 (×2): 40 mg via ORAL
  Filled 2018-05-08 (×2): qty 1

## 2018-05-08 MED ORDER — TRANEXAMIC ACID-NACL 1000-0.7 MG/100ML-% IV SOLN
INTRAVENOUS | Status: AC
  Filled 2018-05-08: qty 100

## 2018-05-08 MED ORDER — MENTHOL 3 MG MT LOZG: 1.0000 | LOZENGE | OROMUCOSAL | Status: DC | PRN

## 2018-05-08 MED ORDER — MIDAZOLAM HCL 5 MG/5ML IJ SOLN
INTRAMUSCULAR | Status: DC | PRN
  Administered 2018-05-08: 2 mg via INTRAVENOUS

## 2018-05-08 MED ORDER — CHLORHEXIDINE GLUCONATE 4 % EX LIQD: 60.0000 mL | Freq: Once | CUTANEOUS | Status: DC

## 2018-05-08 MED ORDER — PHENYLEPHRINE HCL 10 MG/ML IJ SOLN
INTRAMUSCULAR | Status: DC | PRN
  Administered 2018-05-08 (×4): 80 ug via INTRAVENOUS

## 2018-05-08 MED ORDER — METHOCARBAMOL 500 MG PO TABS
500.0000 mg | ORAL_TABLET | Freq: Four times a day (QID) | ORAL | Status: DC | PRN
  Administered 2018-05-08 – 2018-05-09 (×2): 500 mg via ORAL
  Filled 2018-05-08: qty 1

## 2018-05-08 MED ORDER — CEFAZOLIN SODIUM-DEXTROSE 2-4 GM/100ML-% IV SOLN
2.0000 g | Freq: Four times a day (QID) | INTRAVENOUS | Status: AC
  Administered 2018-05-08 – 2018-05-09 (×2): 2 g via INTRAVENOUS
  Filled 2018-05-08 (×2): qty 100

## 2018-05-08 MED ORDER — LORATADINE 10 MG PO TABS
10.0000 mg | ORAL_TABLET | Freq: Every day | ORAL | Status: DC
  Administered 2018-05-08: 10 mg via ORAL
  Filled 2018-05-08 (×3): qty 1

## 2018-05-08 MED ORDER — MELATONIN 3 MG PO TABS
3.0000 mg | ORAL_TABLET | Freq: Every day | ORAL | Status: DC
  Filled 2018-05-08 (×3): qty 1

## 2018-05-08 MED ORDER — FENOFIBRATE 160 MG PO TABS
160.0000 mg | ORAL_TABLET | Freq: Every day | ORAL | Status: DC
  Administered 2018-05-09 – 2018-05-10 (×2): 160 mg via ORAL
  Filled 2018-05-08 (×2): qty 1

## 2018-05-08 MED ORDER — PROPOFOL 10 MG/ML IV BOLUS
INTRAVENOUS | Status: DC | PRN
  Administered 2018-05-08: 150 mg via INTRAVENOUS

## 2018-05-08 MED ORDER — ACETAMINOPHEN 325 MG PO TABS: 325.0000 mg | ORAL_TABLET | Freq: Four times a day (QID) | ORAL | Status: DC | PRN

## 2018-05-08 MED ORDER — OXYCODONE HCL 5 MG/5ML PO SOLN: 5.0000 mg | Freq: Once | ORAL | Status: DC | PRN

## 2018-05-08 MED ORDER — METOCLOPRAMIDE HCL 5 MG PO TABS: 5.0000 mg | ORAL_TABLET | Freq: Three times a day (TID) | ORAL | Status: DC | PRN

## 2018-05-08 MED ORDER — LIDOCAINE 2% (20 MG/ML) 5 ML SYRINGE
INTRAMUSCULAR | Status: AC
  Filled 2018-05-08: qty 10

## 2018-05-08 MED ORDER — HYDROMORPHONE HCL 1 MG/ML IJ SOLN: 0.2500 mg | INTRAMUSCULAR | Status: DC | PRN

## 2018-05-08 MED ORDER — ATORVASTATIN CALCIUM 80 MG PO TABS
80.0000 mg | ORAL_TABLET | Freq: Every day | ORAL | Status: DC
  Administered 2018-05-08 – 2018-05-09 (×2): 80 mg via ORAL
  Filled 2018-05-08 (×2): qty 1

## 2018-05-08 MED ORDER — CITALOPRAM HYDROBROMIDE 20 MG PO TABS
20.0000 mg | ORAL_TABLET | Freq: Every day | ORAL | Status: DC
  Administered 2018-05-09 – 2018-05-10 (×2): 20 mg via ORAL
  Filled 2018-05-08 (×2): qty 1

## 2018-05-08 MED ORDER — DOCUSATE SODIUM 100 MG PO CAPS
100.0000 mg | ORAL_CAPSULE | Freq: Two times a day (BID) | ORAL | Status: DC
  Administered 2018-05-09 – 2018-05-10 (×2): 100 mg via ORAL
  Filled 2018-05-08 (×4): qty 1

## 2018-05-08 MED ORDER — METHOCARBAMOL 1000 MG/10ML IJ SOLN
500.0000 mg | Freq: Four times a day (QID) | INTRAVENOUS | Status: DC | PRN
  Filled 2018-05-08: qty 5

## 2018-05-08 MED ORDER — 0.9 % SODIUM CHLORIDE (POUR BTL) OPTIME
TOPICAL | Status: DC | PRN
  Administered 2018-05-08: 1000 mL

## 2018-05-08 MED ORDER — HYDROCODONE-ACETAMINOPHEN 5-325 MG PO TABS
ORAL_TABLET | ORAL | Status: AC
  Filled 2018-05-08: qty 2

## 2018-05-08 MED ORDER — SCOPOLAMINE 1 MG/3DAYS TD PT72
1.0000 | MEDICATED_PATCH | Freq: Once | TRANSDERMAL | Status: DC
  Administered 2018-05-08: 1.5 mg via TRANSDERMAL

## 2018-05-08 MED ORDER — TRANEXAMIC ACID 1000 MG/10ML IV SOLN: INTRAVENOUS | Status: DC | PRN

## 2018-05-08 MED ORDER — ONDANSETRON HCL 4 MG/2ML IJ SOLN
INTRAMUSCULAR | Status: AC
  Filled 2018-05-08: qty 4

## 2018-05-08 MED ORDER — DEXAMETHASONE SODIUM PHOSPHATE 10 MG/ML IJ SOLN
INTRAMUSCULAR | Status: DC | PRN
  Administered 2018-05-08: 10 mg via INTRAVENOUS

## 2018-05-08 MED ORDER — ONDANSETRON HCL 4 MG PO TABS: 4.0000 mg | ORAL_TABLET | Freq: Four times a day (QID) | ORAL | Status: DC | PRN

## 2018-05-08 MED ORDER — LACTATED RINGERS IV SOLN
INTRAVENOUS | Status: DC
  Administered 2018-05-08 (×3): via INTRAVENOUS

## 2018-05-08 MED ORDER — LIDOCAINE HCL (CARDIAC) PF 100 MG/5ML IV SOSY
PREFILLED_SYRINGE | INTRAVENOUS | Status: DC | PRN
  Administered 2018-05-08: 60 mg via INTRAVENOUS

## 2018-05-08 MED ORDER — METHOCARBAMOL 500 MG PO TABS
ORAL_TABLET | ORAL | Status: AC
  Filled 2018-05-08: qty 1

## 2018-05-08 MED ORDER — EPHEDRINE SULFATE 50 MG/ML IJ SOLN
INTRAMUSCULAR | Status: DC | PRN
  Administered 2018-05-08: 20 mg via INTRAVENOUS

## 2018-05-08 MED ORDER — BUPIVACAINE-EPINEPHRINE (PF) 0.25% -1:200000 IJ SOLN
INTRAMUSCULAR | Status: DC | PRN
  Administered 2018-05-08: 30 mL via PERINEURAL

## 2018-05-08 MED ORDER — OXYCODONE HCL 5 MG PO TABS: 5.0000 mg | ORAL_TABLET | Freq: Once | ORAL | Status: DC | PRN

## 2018-05-08 MED ORDER — KETOROLAC TROMETHAMINE 15 MG/ML IJ SOLN
7.5000 mg | Freq: Four times a day (QID) | INTRAMUSCULAR | Status: AC
  Administered 2018-05-08 – 2018-05-09 (×4): 7.5 mg via INTRAVENOUS
  Filled 2018-05-08 (×4): qty 1

## 2018-05-08 MED ORDER — GLYCOPYRROLATE 0.2 MG/ML IJ SOLN
INTRAMUSCULAR | Status: DC | PRN
  Administered 2018-05-08: 0.2 mg via INTRAVENOUS

## 2018-05-08 MED ORDER — ONDANSETRON HCL 4 MG/2ML IJ SOLN
INTRAMUSCULAR | Status: DC | PRN
  Administered 2018-05-08: 4 mg via INTRAVENOUS

## 2018-05-08 MED ORDER — METOCLOPRAMIDE HCL 5 MG/ML IJ SOLN: 5.0000 mg | Freq: Three times a day (TID) | INTRAMUSCULAR | Status: DC | PRN

## 2018-05-08 MED ORDER — MORPHINE SULFATE (PF) 2 MG/ML IV SOLN
0.5000 mg | INTRAVENOUS | Status: DC | PRN
  Administered 2018-05-09: 1 mg via INTRAVENOUS
  Filled 2018-05-08: qty 1

## 2018-05-08 MED ORDER — BISACODYL 5 MG PO TBEC: 5.0000 mg | DELAYED_RELEASE_TABLET | Freq: Every day | ORAL | Status: DC | PRN

## 2018-05-08 MED ORDER — TRANEXAMIC ACID-NACL 1000-0.7 MG/100ML-% IV SOLN
1000.0000 mg | INTRAVENOUS | Status: AC
  Administered 2018-05-08: 1000 mg via INTRAVENOUS

## 2018-05-08 MED ORDER — PROMETHAZINE HCL 25 MG/ML IJ SOLN: 6.2500 mg | INTRAMUSCULAR | Status: DC | PRN

## 2018-05-08 MED ORDER — DIPHENHYDRAMINE HCL 12.5 MG/5ML PO ELIX: 12.5000 mg | ORAL_SOLUTION | ORAL | Status: DC | PRN

## 2018-05-08 MED ORDER — MIDAZOLAM HCL 2 MG/2ML IJ SOLN
INTRAMUSCULAR | Status: AC
  Filled 2018-05-08: qty 2

## 2018-05-08 MED ORDER — ONDANSETRON HCL 4 MG/2ML IJ SOLN: 4.0000 mg | Freq: Four times a day (QID) | INTRAMUSCULAR | Status: DC | PRN

## 2018-05-08 MED ORDER — ALBUMIN HUMAN 5 % IV SOLN
INTRAVENOUS | Status: DC | PRN
  Administered 2018-05-08: 13:00:00 via INTRAVENOUS

## 2018-05-08 MED ORDER — PHENOL 1.4 % MT LIQD: 1.0000 | OROMUCOSAL | Status: DC | PRN

## 2018-05-08 MED ORDER — EPHEDRINE 5 MG/ML INJ
INTRAVENOUS | Status: AC
  Filled 2018-05-08: qty 10

## 2018-05-08 MED ORDER — HYDROCODONE-ACETAMINOPHEN 7.5-325 MG PO TABS
1.0000 | ORAL_TABLET | ORAL | Status: DC | PRN
  Administered 2018-05-09 (×2): 1 via ORAL
  Administered 2018-05-09: 2 via ORAL
  Administered 2018-05-09 – 2018-05-10 (×2): 1 via ORAL
  Administered 2018-05-10: 2 via ORAL
  Administered 2018-05-10: 1 via ORAL
  Administered 2018-05-10: 2 via ORAL
  Filled 2018-05-08: qty 1
  Filled 2018-05-08: qty 2
  Filled 2018-05-08 (×5): qty 1
  Filled 2018-05-08: qty 2
  Filled 2018-05-08: qty 1

## 2018-05-08 MED ORDER — ACETAMINOPHEN 500 MG PO TABS
500.0000 mg | ORAL_TABLET | Freq: Four times a day (QID) | ORAL | Status: AC
  Administered 2018-05-08 – 2018-05-09 (×3): 500 mg via ORAL
  Filled 2018-05-08 (×3): qty 1

## 2018-05-08 MED ORDER — SCOPOLAMINE 1 MG/3DAYS TD PT72
MEDICATED_PATCH | TRANSDERMAL | Status: AC
  Administered 2018-05-08: 1.5 mg via TRANSDERMAL
  Filled 2018-05-08: qty 1

## 2018-05-08 MED ORDER — ASPIRIN EC 325 MG PO TBEC
325.0000 mg | DELAYED_RELEASE_TABLET | Freq: Two times a day (BID) | ORAL | Status: DC
  Administered 2018-05-09 – 2018-05-10 (×3): 325 mg via ORAL
  Filled 2018-05-08 (×3): qty 1

## 2018-05-08 MED ORDER — LACTATED RINGERS IV SOLN
INTRAVENOUS | Status: DC
  Administered 2018-05-08: 19:00:00 via INTRAVENOUS

## 2018-05-08 MED ORDER — PHENYLEPHRINE 40 MCG/ML (10ML) SYRINGE FOR IV PUSH (FOR BLOOD PRESSURE SUPPORT)
PREFILLED_SYRINGE | INTRAVENOUS | Status: AC
  Filled 2018-05-08: qty 10

## 2018-05-08 MED ORDER — TRANEXAMIC ACID-NACL 1000-0.7 MG/100ML-% IV SOLN
1000.0000 mg | Freq: Once | INTRAVENOUS | Status: AC
  Administered 2018-05-08: 1000 mg via INTRAVENOUS
  Filled 2018-05-08: qty 100

## 2018-05-08 MED ORDER — DEXAMETHASONE SODIUM PHOSPHATE 10 MG/ML IJ SOLN
INTRAMUSCULAR | Status: AC
  Filled 2018-05-08: qty 1

## 2018-05-08 MED ORDER — CEFAZOLIN SODIUM-DEXTROSE 2-4 GM/100ML-% IV SOLN
2.0000 g | INTRAVENOUS | Status: AC
  Administered 2018-05-08 (×2): 2 g via INTRAVENOUS

## 2018-05-08 SURGICAL SUPPLY — 47 items
BLADE SAW SGTL 18X1.27X75 (BLADE) ×2 IMPLANT
BLADE SAW SGTL 18X1.27X75MM (BLADE) ×1
CELLS DAT CNTRL 66122 CELL SVR (MISCELLANEOUS) ×1 IMPLANT
COVER PERINEAL POST (MISCELLANEOUS) ×3 IMPLANT
COVER SURGICAL LIGHT HANDLE (MISCELLANEOUS) ×3 IMPLANT
COVER WAND RF STERILE (DRAPES) ×3 IMPLANT
CUP GRIPTON 48MM 100 HIP (Hips) ×3 IMPLANT
DRAPE C-ARM 42X72 X-RAY (DRAPES) ×3 IMPLANT
DRAPE STERI IOBAN 125X83 (DRAPES) ×3 IMPLANT
DRAPE U-SHAPE 47X51 STRL (DRAPES) ×9 IMPLANT
DRSG AQUACEL AG ADV 3.5X10 (GAUZE/BANDAGES/DRESSINGS) ×3 IMPLANT
DRSG OPSITE POSTOP 4X6 (GAUZE/BANDAGES/DRESSINGS) ×3 IMPLANT
DURAPREP 26ML APPLICATOR (WOUND CARE) ×3 IMPLANT
ELECT BLADE 4.0 EZ CLEAN MEGAD (MISCELLANEOUS) ×3
ELECT REM PT RETURN 9FT ADLT (ELECTROSURGICAL) ×3
ELECTRODE BLDE 4.0 EZ CLN MEGD (MISCELLANEOUS) ×1 IMPLANT
ELECTRODE REM PT RTRN 9FT ADLT (ELECTROSURGICAL) ×1 IMPLANT
ELIMINATOR HOLE APEX DEPUY (Hips) ×3 IMPLANT
FACESHIELD WRAPAROUND (MASK) ×6 IMPLANT
FEM STEM 12/14 TAPER SZ 4 HIP (Orthopedic Implant) ×3 IMPLANT
FEMORAL STEM 12/14 TPR SZ4 HIP (Orthopedic Implant) ×1 IMPLANT
GLOVE BIO SURGEON STRL SZ8 (GLOVE) ×6 IMPLANT
GLOVE BIOGEL PI IND STRL 8 (GLOVE) ×2 IMPLANT
GLOVE BIOGEL PI INDICATOR 8 (GLOVE) ×4
GOWN STRL REUS W/ TWL LRG LVL3 (GOWN DISPOSABLE) ×1 IMPLANT
GOWN STRL REUS W/ TWL XL LVL3 (GOWN DISPOSABLE) ×2 IMPLANT
GOWN STRL REUS W/TWL LRG LVL3 (GOWN DISPOSABLE) ×2
GOWN STRL REUS W/TWL XL LVL3 (GOWN DISPOSABLE) ×4
HEAD FEMORAL 32 CERAMIC (Hips) ×3 IMPLANT
KIT BASIN OR (CUSTOM PROCEDURE TRAY) ×3 IMPLANT
KIT TURNOVER KIT B (KITS) ×3 IMPLANT
LINER ACET 32X48 (Liner) ×3 IMPLANT
MANIFOLD NEPTUNE II (INSTRUMENTS) ×3 IMPLANT
NEEDLE HYPO 22GX1.5 SAFETY (NEEDLE) ×3 IMPLANT
NS IRRIG 1000ML POUR BTL (IV SOLUTION) ×3 IMPLANT
PACK TOTAL JOINT (CUSTOM PROCEDURE TRAY) ×3 IMPLANT
PAD ARMBOARD 7.5X6 YLW CONV (MISCELLANEOUS) ×3 IMPLANT
RTRCTR WOUND ALEXIS 18CM MED (MISCELLANEOUS) ×3
SUT ETHIBOND NAB CT1 #1 30IN (SUTURE) ×6 IMPLANT
SUT VIC AB 1 CT1 27 (SUTURE) ×2
SUT VIC AB 1 CT1 27XBRD ANBCTR (SUTURE) ×1 IMPLANT
SUT VIC AB 2-0 CT1 27 (SUTURE) ×2
SUT VIC AB 2-0 CT1 TAPERPNT 27 (SUTURE) ×1 IMPLANT
SUT VIC AB 3-0 PS2 18 (SUTURE) ×2
SUT VIC AB 3-0 PS2 18XBRD (SUTURE) ×1 IMPLANT
SUT VLOC 180 0 24IN GS25 (SUTURE) ×3 IMPLANT
SYR 50ML LL SCALE MARK (SYRINGE) ×3 IMPLANT

## 2018-05-08 NOTE — Progress Notes (Signed)
Pt arrived to room 5N18 via bed after surgery. Received report from [place], [place] in PACU. See assessment. Will continue to monitor.

## 2018-05-08 NOTE — Anesthesia Procedure Notes (Signed)
Procedure Name: LMA Insertion Date/Time: 05/08/2018 12:10 PM Performed by: Rodert Hinch T, CRNA Pre-anesthesia Checklist: Patient identified, Emergency Drugs available, Suction available and Patient being monitored Patient Re-evaluated:Patient Re-evaluated prior to induction Oxygen Delivery Method: Circle system utilized Preoxygenation: Pre-oxygenation with 100% oxygen Induction Type: IV induction Ventilation: Mask ventilation without difficulty LMA: LMA inserted LMA Size: 5.0 Number of attempts: 1 Airway Equipment and Method: Patient positioned with wedge pillow Placement Confirmation: positive ETCO2 and breath sounds checked- equal and bilateral Tube secured with: Tape Dental Injury: Teeth and Oropharynx as per pre-operative assessment

## 2018-05-08 NOTE — Transfer of Care (Signed)
Immediate Anesthesia Transfer of Care Note  Patient: Tracy Rasmussen  Procedure(s) Performed: TOTAL HIP ARTHROPLASTY ANTERIOR APPROACH (Right Hip)  Patient Location: PACU  Anesthesia Type:General  Level of Consciousness: awake, alert  and oriented  Airway & Oxygen Therapy: Patient Spontanous Breathing and Patient connected to nasal cannula oxygen  Post-op Assessment: Report given to RN, Post -op Vital signs reviewed and stable and Patient moving all extremities  Post vital signs: Reviewed and stable  Last Vitals:  Vitals Value Taken Time  BP 182/90 05/08/2018  1:54 PM  Temp 36.2 C 05/08/2018  1:52 PM  Pulse 108 05/08/2018  2:01 PM  Resp 17 05/08/2018  2:01 PM  SpO2 100 % 05/08/2018  2:01 PM  Vitals shown include unvalidated device data.  Last Pain:  Vitals:   05/08/18 0915  TempSrc:   PainSc: 6          Complications: No apparent anesthesia complications

## 2018-05-08 NOTE — Evaluation (Signed)
Physical Therapy Evaluation Patient Details Name: Tracy Rasmussen MRN: [id number] DOB: 1950/09/19 Today's Date: 05/08/2018   History of Present Illness  pt is a 68 y/o female with h/o spondylolisthesis, anxiety d/o presenting with R anterior hip DJD, pt s/p R Anterior approach THA.  Clinical Impression  Pt admitted with/for elective RTHA.  Pt generally needing minimal assist with occasional moderate assist.  Pt currently limited functionally due to the problems listed below.  (see problems list.)  Pt will benefit from PT to maximize function and safety to be able to get home safely with no available assist.  But pt realizes she needs to be independent.     Follow Up Recommendations Home health PT;Follow surgeon's recommendation for DC plan and follow-up therapies(pt will go to SNF if she can't attain independent level)    Equipment Recommendations  None recommended by PT    Recommendations for Other Services       Precautions / Restrictions Precautions Precautions: Anterior Hip Restrictions Weight Bearing Restrictions: Yes RLE Weight Bearing: Weight bearing as tolerated      Mobility  Bed Mobility Overal bed mobility: Needs Assistance Bed Mobility: Supine to Sit;Sit to Supine     Supine to sit: Min assist Sit to supine: Mod assist   General bed mobility comments: cues for technique, minimal assist with legs  Transfers Overall transfer level: Needs assistance   Transfers: Sit to/from Stand Sit to Stand: Min guard            Ambulation/Gait Ambulation/Gait assistance: Min guard;Min assist Gait Distance (Feet): 15 Feet(then 40 feet) Assistive device: Rolling walker (2 wheeled) Gait Pattern/deviations: Step-to pattern        Stairs            Wheelchair Mobility    Modified Rankin (Stroke Patients Only)       Balance Overall balance assessment: No apparent balance deficits (not formally assessed)                                            Pertinent Vitals/Pain Pain Assessment: Faces Faces Pain Scale: Hurts even more Pain Location: hip Pain Descriptors / Indicators: Aching;Sore;Grimacing;Guarding Pain Intervention(s): Monitored during session    Home Living Family/patient expects to be discharged to:: Private residence Living Arrangements: Alone Available Help at Discharge: Other (Comment)(She has no one, but hopes to go straight home) Type of Home: Apartment Home Access: Stairs to enter Entrance Stairs-Rails: Right;Left;Can reach both Entrance Stairs-Number of Steps: 8+8 Home Layout: One level Home Equipment: Cane - single point;Grab bars - tub/shower      Prior Function Level of Independence: Independent with assistive device(s)         Comments: uses cane for functional mobility     Hand Dominance        Extremity/Trunk Assessment   Upper Extremity Assessment Upper Extremity Assessment: Overall WFL for tasks assessed    Lower Extremity Assessment Lower Extremity Assessment: RLE deficits/detail RLE Deficits / Details: painful and weak, grossly 4-/5       Communication   Communication: No difficulties  Cognition Arousal/Alertness: Awake/alert Behavior During Therapy: WFL for tasks assessed/performed Overall Cognitive Status: Within Functional Limits for tasks assessed  General Comments      Exercises Total Joint Exercises Ankle Circles/Pumps: AROM;20 reps Quad Sets: AROM;Both;10 reps Gluteal Sets: AROM;Both;10 reps Heel Slides: AROM;10 reps;Right Hip ABduction/ADduction: AAROM;Right;5 reps   Assessment/Plan    PT Assessment Patient needs continued PT services  PT Problem List Decreased strength;Decreased range of motion;Decreased activity tolerance;Decreased mobility;Decreased knowledge of use of DME;Pain       PT Treatment Interventions DME instruction;Gait training;Stair training;Functional mobility  training;Therapeutic activities;Patient/family education;Therapeutic exercise    PT Goals (Current goals can be found in the Care Plan section)  Acute Rehab PT Goals Patient Stated Goal: go home directly PT Goal Formulation: With patient Time For Goal Achievement: 05/15/18 Potential to Achieve Goals: Good    Frequency BID   Barriers to discharge        Co-evaluation               AM-PAC PT "6 Clicks" Mobility  Outcome Measure Help needed turning from your back to your side while in a flat bed without using bedrails?: A Little Help needed moving from lying on your back to sitting on the side of a flat bed without using bedrails?: A Little Help needed moving to and from a bed to a chair (including a wheelchair)?: A Little Help needed standing up from a chair using your arms (e.g., wheelchair or bedside chair)?: A Little Help needed to walk in hospital room?: A Little Help needed climbing 3-5 steps with a railing? : A Little 6 Click Score: 18    End of Session   Activity Tolerance: Patient tolerated treatment well Patient left: in bed;with call bell/phone within reach;with bed alarm set;with family/visitor present Nurse Communication: Mobility status PT Visit Diagnosis: Other abnormalities of gait and mobility (R26.89);Pain Pain - Right/Left: Right Pain - part of body: Hip    Time: 4496-7591 PT Time Calculation (min) (ACUTE ONLY): 28 min   Charges:   PT Evaluation $PT Eval Low Complexity: 1 Low PT Treatments $Gait Training: 8-22 mins        05/08/2018  Donnella Sham, PT Acute Rehabilitation Services [phone]  (pager) [phone]  (office)  Tessie Fass Deicy Rusk 05/08/2018, 8:03 PM

## 2018-05-08 NOTE — Anesthesia Postprocedure Evaluation (Signed)
Anesthesia Post Note  Patient: Tracy Rasmussen Setting  Procedure(s) Performed: TOTAL HIP ARTHROPLASTY ANTERIOR APPROACH (Right Hip)     Patient location during evaluation: PACU Anesthesia Type: Spinal Level of consciousness: oriented and awake and alert Pain management: pain level controlled Vital Signs Assessment: post-procedure vital signs reviewed and stable Respiratory status: spontaneous breathing and respiratory function stable Cardiovascular status: blood pressure returned to baseline and stable Postop Assessment: no headache, no backache and no apparent nausea or vomiting Anesthetic complications: no    Last Vitals:  Vitals:   05/08/18 0908 05/08/18 1352  BP: (!) 163/71 (!) 182/90  Pulse: 64 (!) 108  Resp: 18   Temp: 36.9 C (!) 36.2 C  SpO2: 99%     Last Pain:  Vitals:   05/08/18 0915  TempSrc:   PainSc: [place]

## 2018-05-08 NOTE — Plan of Care (Signed)
  Problem: Pain Managment: Goal: General experience of comfort will improve Outcome: Progressing   Problem: Safety: Goal: Ability to remain free from injury will improve Outcome: Progressing   

## 2018-05-08 NOTE — Plan of Care (Signed)
Problem: Education: Goal: Knowledge of General Education information will improve Description Including pain rating scale, medication(s)/side effects and non-pharmacologic comfort measures Outcome: Progressing   Problem: Health Behavior/Discharge Planning: Goal: Ability to manage health-related needs will improve Outcome: Progressing   Problem: Clinical Measurements: Goal: Respiratory complications will improve Outcome: Progressing   Problem: Activity: Goal: Risk for activity intolerance will decrease Outcome: Progressing   Problem: Nutrition: Goal: Adequate nutrition will be maintained Outcome: Progressing   Problem: Coping: Goal: Level of anxiety will decrease Outcome: Progressing   Problem: Elimination: Goal: Will not experience complications related to urinary retention Outcome: Progressing   Problem: Pain Managment: Goal: General experience of comfort will improve Outcome: Progressing   Problem: Safety: Goal: Ability to remain free from injury will improve Outcome: Progressing   Problem: Skin Integrity: Goal: Risk for impaired skin integrity will decrease Outcome: Progressing   

## 2018-05-08 NOTE — Op Note (Signed)

## 2018-05-08 NOTE — Interval H&P Note (Signed)
History and Physical Interval Note:  05/08/2018 9:12 AM  Tracy Rasmussen  has presented today for surgery, with the diagnosis of Right Anterior Hip Degenative Joint Disease  The various methods of treatment have been discussed with the patient and family. After consideration of risks, benefits and other options for treatment, the patient has consented to  Procedure(s): TOTAL HIP ARTHROPLASTY ANTERIOR APPROACH (Right) as a surgical intervention .  The patient's history has been reviewed, patient examined, no change in status, stable for surgery.  I have reviewed the patient's chart and labs.  Questions were answered to the patient's satisfaction.     Hessie Dibble

## 2018-05-08 NOTE — Anesthesia Preprocedure Evaluation (Addendum)
Anesthesia Evaluation  Patient identified by MRN, date of birth, ID band Patient awake    Reviewed: Allergy & Precautions, NPO status , Patient's Chart, lab work & pertinent test results  History of Anesthesia Complications (+) PONV  Airway Mallampati: II  TM Distance: >3 FB Neck ROM: Full    Dental no notable dental hx.    Pulmonary neg pulmonary ROS, former smoker,    Pulmonary exam normal breath sounds clear to auscultation       Cardiovascular negative cardio ROS Normal cardiovascular exam Rhythm:Regular Rate:Normal     Neuro/Psych Anxiety negative neurological ROS  negative psych ROS   GI/Hepatic negative GI ROS, Neg liver ROS,   Endo/Other  negative endocrine ROS  Renal/GU Renal InsufficiencyRenal diseasenegative Renal ROS  negative genitourinary   Musculoskeletal negative musculoskeletal ROS (+)   Abdominal   Peds negative pediatric ROS (+)  Hematology negative hematology ROS (+)   Anesthesia Other Findings   Reproductive/Obstetrics negative OB ROS                             Anesthesia Physical  Anesthesia Plan  ASA: II  Anesthesia Plan: General   Post-op Pain Management:    Induction: Intravenous  PONV Risk Score and Plan: 4 or greater and Ondansetron, Dexamethasone, Midazolam and Scopolamine patch - Pre-op  Airway Management Planned: LMA  Additional Equipment:   Intra-op Plan:   Post-operative Plan: Extubation in OR  Informed Consent: I have reviewed the patients History and Physical, chart, labs and discussed the procedure including the risks, benefits and alternatives for the proposed anesthesia with the patient or authorized representative who has indicated his/her understanding and acceptance.     Dental advisory given  Plan Discussed with: CRNA  Anesthesia Plan Comments: (Patient would prefer GA due to previous back surgery and hardware placement.)       Anesthesia Quick Evaluation

## 2018-05-09 ENCOUNTER — Encounter (HOSPITAL_COMMUNITY): Payer: Self-pay | Admitting: Orthopaedic Surgery

## 2018-05-09 NOTE — Plan of Care (Signed)
  Problem: Pain Managment: Goal: General experience of comfort will improve Outcome: Progressing   Problem: Safety: Goal: Ability to remain free from injury will improve Outcome: Progressing   

## 2018-05-09 NOTE — Progress Notes (Signed)
Subjective: 1 Day Post-Op Procedure(s) (LRB): TOTAL HIP ARTHROPLASTY ANTERIOR APPROACH (Right)   Patient feels great. She is getting up and walking around the room. She is working on finding a friend to help her at home so she doesn't have to go to SNF  Activity level:  wbat Diet tolerance:  ok Voiding:  ok Patient reports pain as mild.    Objective: Vital signs in last 24 hours: Temp:  [97.2 F (36.2 C)-98.8 F (37.1 C)] 98.5 F (36.9 C) (01/29 0518) Pulse Rate:  [64-108] 72 (01/29 0518) Resp:  [13-28] 20 (01/29 0518) BP: (105-182)/(40-103) 155/73 (01/29 0518) SpO2:  [91 %-100 %] 99 % (01/29 0518) Weight:  [72.1 kg] 72.1 kg (01/28 0908)  Labs: Recent Labs    05/08/18 1104  HGB 12.5   Recent Labs    05/08/18 1104  WBC 8.9  RBC 4.73  HCT 41.5  PLT 290   No results for input(s): NA, K, CL, CO2, BUN, CREATININE, GLUCOSE, CALCIUM in the last 72 hours. No results for input(s): LABPT, INR in the last 72 hours.  Physical Exam:  Neurologically intact ABD soft Neurovascular intact Sensation intact distally Intact pulses distally Dorsiflexion/Plantar flexion intact Incision: dressing C/D/I and no drainage No cellulitis present Compartment soft  Assessment/Plan:  1 Day Post-Op Procedure(s) (LRB): TOTAL HIP ARTHROPLASTY ANTERIOR APPROACH (Right) Advance diet Up with therapy Discharge home with home health once cleared by PT and doing well. She is working on finding a friend or family to help her at home. Continue on ASA 325mg  BID x 4 weeks post op. Follow up in office 2 weeks post op.  Tracy Rasmussen Tracy Rasmussen 05/09/2018, 7:40 AM

## 2018-05-09 NOTE — Plan of Care (Signed)

## 2018-05-09 NOTE — Progress Notes (Signed)
Physical Therapy Treatment Patient Details Name: Tracy Rasmussen MRN: [id number] DOB: 11-Aug-1950 Today's Date: 05/09/2018    History of Present Illness pt is a 68 y/o female with h/o spondylolisthesis, anxiety d/o presenting with R anterior hip DJD, pt s/p R Anterior approach THA.    PT Comments    Continuing work on functional mobility and activity tolerance;  Overall she is moving quite well, and she so very much wants to go home; she tells me she has friends to help, but that she will be home for hours at a time alone; Ordered OT per protocol for ADLs; I anticipate good progress and that she will be able to dc home   Follow Up Recommendations  Home health PT;Follow surgeon's recommendation for DC plan and follow-up therapies(She's looking into more assist at home)     Equipment Recommendations  None recommended by PT    Recommendations for Other Services       Precautions / Restrictions Restrictions Weight Bearing Restrictions: Yes RLE Weight Bearing: Weight bearing as tolerated    Mobility  Bed Mobility Overal bed mobility: Needs Assistance Bed Mobility: Supine to Sit     Supine to sit: Min guard     General bed mobility comments: Cues for technqiue  Transfers Overall transfer level: Needs assistance Equipment used: Rolling walker (2 wheeled) Transfers: Sit to/from Stand Sit to Stand: Min guard         General transfer comment: Cues for hand placement and safety  Ambulation/Gait Ambulation/Gait assistance: Min guard Gait Distance (Feet): 150 Feet Assistive device: Rolling walker (2 wheeled) Gait Pattern/deviations: Step-through pattern(emerging) Gait velocity: slow   General Gait Details: Cues to stand tall on RLE in stance   Stairs Stairs: Yes Stairs assistance: Min assist Stair Management: One rail Left;Forwards;Step to pattern(with folded RW in R hand) Number of Stairs: 6 General stair comments: Cues for technqiue, and used a variety of handhold  places to manage RW comfortably and confidently   Wheelchair Mobility    Modified Rankin (Stroke Patients Only)       Balance                                            Cognition Arousal/Alertness: Awake/alert Behavior During Therapy: WFL for tasks assessed/performed Overall Cognitive Status: Within Functional Limits for tasks assessed                                        Exercises Total Joint Exercises Ankle Circles/Pumps: AROM;20 reps Quad Sets: AROM;Right;10 reps Gluteal Sets: AROM;Both;10 reps Towel Squeeze: AROM;Both;10 reps Heel Slides: AROM;10 reps;Right Hip ABduction/ADduction: AROM;AAROM;Right;10 reps Bridges: AROM;Both;10 reps    General Comments        Pertinent Vitals/Pain Pain Assessment: Faces Faces Pain Scale: Hurts little more Pain Location: hip Pain Descriptors / Indicators: Aching;Sore;Grimacing;Guarding Pain Intervention(s): Monitored during session    Home Living                      Prior Function            PT Goals (current goals can now be found in the care plan section) Acute Rehab PT Goals Patient Stated Goal: go home directly PT Goal Formulation: With patient Time For Goal Achievement: 05/15/18 Potential to Achieve Goals: Good  Progress towards PT goals: Progressing toward goals    Frequency    7X/week      PT Plan Current plan remains appropriate;Frequency needs to be updated    Co-evaluation              AM-PAC PT "6 Clicks" Mobility   Outcome Measure  Help needed turning from your back to your side while in a flat bed without using bedrails?: A Little Help needed moving from lying on your back to sitting on the side of a flat bed without using bedrails?: A Little Help needed moving to and from a bed to a chair (including a wheelchair)?: A Little Help needed standing up from a chair using your arms (e.g., wheelchair or bedside chair)?: A Little Help needed to walk  in hospital room?: None Help needed climbing 3-5 steps with a railing? : A Little 6 Click Score: 19    End of Session Equipment Utilized During Treatment: Gait belt Activity Tolerance: Patient tolerated treatment well Patient left: in chair;with call bell/phone within reach Nurse Communication: Mobility status PT Visit Diagnosis: Other abnormalities of gait and mobility (R26.89);Pain Pain - Right/Left: Right Pain - part of body: Hip     Time: 1009-1050 PT Time Calculation (min) (ACUTE ONLY): 41 min  Charges:  $Gait Training: 23-37 mins $Therapeutic Exercise: 8-22 mins                     Roney Marion, PT  Acute Rehabilitation Services Pager [phone] Office [place] 05/09/2018, 12:56 PM

## 2018-05-09 NOTE — Progress Notes (Signed)
Physical Therapy Treatment Patient Details Name: Tracy Rasmussen MRN: [id number] DOB: [birth year] Today's Date: 05/09/2018    History of Present Illness pt is a 68 y/o female with h/o spondylolisthesis, anxiety d/o presenting with R anterior hip DJD, pt s/p R Anterior approach THA.    PT Comments    Continuing work on functional mobility and activity tolerance;  Focused on progressive amb this afternoon, with good prgression of gait distance; We also practiced stairs with rail on R this session and folded RW in L hand, to continue to work on options for managing at home independently; overall moving well and excellent progress; I believe she can dc home, with assist "on call" and close if needed   Follow Up Recommendations  Home health PT;Follow surgeon's recommendation for DC plan and follow-up therapies(She's looking into more assist at home)     Equipment Recommendations  None recommended by PT    Recommendations for Other Services       Precautions / Restrictions Precautions Precaution Booklet Issued: Yes (comment) Restrictions RLE Weight Bearing: Weight bearing as tolerated    Mobility  Bed Mobility Overal bed mobility: Needs Assistance Bed Mobility: Supine to Sit     Supine to sit: Min guard     General bed mobility comments: Cues for technqiue  Transfers Overall transfer level: Needs assistance Equipment used: Rolling walker (2 wheeled) Transfers: Sit to/from Stand Sit to Stand: Min guard         General transfer comment: Cues for hand placement and safety  Ambulation/Gait Ambulation/Gait assistance: Min guard Gait Distance (Feet): 200 Feet Assistive device: Rolling walker (2 wheeled) Gait Pattern/deviations: Step-through pattern Gait velocity: slow   General Gait Details: Cues to stand tall on RLE in stance   Stairs Stairs: Yes Stairs assistance: Min assist Stair Management: One rail Right;Forwards;With walker(with folded RW in L hand) Number of  Stairs: 12 General stair comments: Cues for technqiue, and used a variety of handhold places to manage RW comfortably and confidently   Wheelchair Mobility    Modified Rankin (Stroke Patients Only)       Balance                                            Cognition Arousal/Alertness: Awake/alert Behavior During Therapy: WFL for tasks assessed/performed Overall Cognitive Status: Within Functional Limits for tasks assessed                                        Exercises      General Comments        Pertinent Vitals/Pain Pain Assessment: Faces Faces Pain Scale: Hurts a little bit Pain Location: hip Pain Descriptors / Indicators: Aching;Sore;Grimacing;Guarding Pain Intervention(s): Monitored during session    Home Living                      Prior Function            PT Goals (current goals can now be found in the care plan section) Acute Rehab PT Goals Patient Stated Goal: go home directly PT Goal Formulation: With patient Time For Goal Achievement: 05/15/18 Potential to Achieve Goals: Good Progress towards PT goals: Progressing toward goals    Frequency    7X/week      PT  Plan Current plan remains appropriate;Frequency needs to be updated    Co-evaluation              AM-PAC PT "6 Clicks" Mobility   Outcome Measure  Help needed turning from your back to your side while in a flat bed without using bedrails?: A Little Help needed moving from lying on your back to sitting on the side of a flat bed without using bedrails?: None Help needed moving to and from a bed to a chair (including a wheelchair)?: A Little Help needed standing up from a chair using your arms (e.g., wheelchair or bedside chair)?: A Little Help needed to walk in hospital room?: None Help needed climbing 3-5 steps with a railing? : A Little 6 Click Score: 20    End of Session Equipment Utilized During Treatment: Gait belt Activity  Tolerance: Patient tolerated treatment well Patient left: in chair;with call bell/phone within reach Nurse Communication: Mobility status PT Visit Diagnosis: Other abnormalities of gait and mobility (R26.89);Pain Pain - Right/Left: Right Pain - part of body: Hip     Time: 1740-8144 PT Time Calculation (min) (ACUTE ONLY): 23 min  Charges:  $Gait Training: 23-37 mins                     Roney Marion, [place] Pager [phone] Office [place] 05/09/2018, 4:44 PM

## 2018-05-10 MED ORDER — ASPIRIN 325 MG PO TBEC: 325.0000 mg | DELAYED_RELEASE_TABLET | Freq: Two times a day (BID) | ORAL | 0 refills | Status: DC

## 2018-05-10 NOTE — Discharge Summary (Signed)
Patient ID: Tracy Rasmussen MRN: [id number] DOB/AGE: 1950-05-11 68 y.o.  Admit date: 05/08/2018 Discharge date: 05/10/2018  Admission Diagnoses:  Principal Problem:   Primary osteoarthritis of right hip   Discharge Diagnoses:  Same  Past Medical History:  Diagnosis Date  . Anxiety disorder   . Complication of anesthesia   . Mixed hyperlipidemia   . Osteopenia   . Over weight   . PONV (postoperative nausea and vomiting)   . Pulmonary nodules   . Small lymphocytic B-cell lymphoma involving skin ([place])   . Spondylolisthesis of lumbar region   . Vitamin D deficiency     Surgeries: Procedure(s): TOTAL HIP ARTHROPLASTY ANTERIOR APPROACH on 05/08/2018   Consultants:   Discharged Condition: Improved  Hospital Course: Tracy Rasmussen is an 68 y.o. female who was admitted 05/08/2018 for operative treatment ofPrimary osteoarthritis of right hip. Patient has severe unremitting pain that affects sleep, daily activities, and work/hobbies. After pre-op clearance the patient was taken to the operating room on 05/08/2018 and underwent  Procedure(s): TOTAL HIP ARTHROPLASTY ANTERIOR APPROACH.    Patient was given perioperative antibiotics:  Anti-infectives (From admission, onward)   Start     Dose/Rate Route Frequency Ordered Stop   05/08/18 1930  ceFAZolin (ANCEF) IVPB 2g/100 mL premix     2 g 200 mL/hr over 30 Minutes Intravenous Every 6 hours 05/08/18 1740 05/09/18 0126   05/08/18 0915  ceFAZolin (ANCEF) IVPB 2g/100 mL premix     2 g 200 mL/hr over 30 Minutes Intravenous On call to O.R. 05/08/18 0900 05/08/18 1209   05/08/18 0904  ceFAZolin (ANCEF) 2-4 GM/100ML-% IVPB    Note to Pharmacy:  Starleen Arms   : cabinet override      05/08/18 0904 05/08/18 1205       Patient was given sequential compression devices, early ambulation, and chemoprophylaxis to prevent DVT.  Patient benefited maximally from hospital stay and there were no complications.    Recent vital signs:  Patient  Vitals for the past 24 hrs:  BP Temp Temp src Pulse Resp SpO2  05/10/18 0341 115/61 98.2 F (36.8 C) Oral 60 18 95 %  05/09/18 2013 132/69 99.8 F (37.7 C) Oral 74 18 99 %  05/09/18 1601 - 99.5 F (37.5 C) Oral - - -  05/09/18 1419 136/68 100.3 F (37.9 C) Oral 72 19 96 %     Recent laboratory studies:  Recent Labs    05/08/18 1104  WBC 8.9  HGB 12.5  HCT 41.5  PLT 290     Discharge Medications:   Allergies as of 05/10/2018      Reactions   Iodine Hives   Ivp Dye [iodinated Diagnostic Agents] Hives      Medication List    TAKE these medications   acetaminophen 500 MG tablet Commonly known as:  TYLENOL Take 1,000 mg by mouth at bedtime as needed for mild pain, moderate pain, fever or headache.   ALPRAZolam 0.25 MG tablet Commonly known as:  XANAX Take 0.25 mg by mouth at bedtime.   aspirin 325 MG EC tablet Take 1 tablet (325 mg total) by mouth 2 (two) times daily after a meal.   atorvastatin 80 MG tablet Commonly known as:  LIPITOR Take 1 tablet (80 mg total) by mouth daily. What changed:  when to take this   Calcium-Vitamin D-Vitamin K 500-100-40 MG-UNT-MCG Chew Chew 2 each by mouth every morning.   cetirizine 10 MG tablet Commonly known as:  ZYRTEC Take 10 mg by mouth  at bedtime.   citalopram 20 MG tablet Commonly known as:  CELEXA Take 20 mg by mouth daily.   fenofibrate 160 MG tablet Take 1 tablet (160 mg total) by mouth daily.   Melatonin 3 MG Caps Take 3 mg by mouth at bedtime.   MULTIPLE VITAMIN PO Take 2 tablets by mouth daily after breakfast.   omega-3 acid ethyl esters 1 g capsule Commonly known as:  LOVAZA Take 2 capsules (2 g total) by mouth 2 (two) times daily. What changed:    how much to take  when to take this   omeprazole 20 MG capsule Commonly known as:  PRILOSEC Take 20 mg by mouth every morning.   traMADol 50 MG tablet Commonly known as:  ULTRAM Take 50-100 mg by mouth every 6 (six) hours as needed for moderate  pain.   Vitamin D3 125 MCG (5000 UT) Caps Take 5,000 Units by mouth daily after breakfast.            Durable Medical Equipment  (From admission, onward)         Start     Ordered   05/08/18 1741  DME Walker rolling  Once    Question:  Patient needs a walker to treat with the following condition  Answer:  Primary osteoarthritis of right hip   05/08/18 1740   05/08/18 1741  DME 3 n 1  Once     05/08/18 1740   05/08/18 1741  DME Bedside commode  Once    Question:  Patient needs a bedside commode to treat with the following condition  Answer:  Primary osteoarthritis of right hip   05/08/18 1740          Diagnostic Studies: Dg Chest 2 View  Result Date: 04/30/2018 CLINICAL DATA:  Pre-op respiratory exam for right hip surgery. EXAM: CHEST - 2 VIEW COMPARISON:  None. FINDINGS: The heart size and mediastinal contours are within normal limits. Both lungs are clear. Several old right anterior rib fracture deformities incidentally noted. IMPRESSION: No active cardiopulmonary disease. Electronically Signed   By: Earle Gell M.D.   On: 04/30/2018 15:29   Dg C-arm 1-60 Min  Result Date: 05/08/2018 CLINICAL DATA:  68 year old female post right hip replacement. Initial encounter. EXAM: OPERATIVE right HIP (WITH PELVIS IF PERFORMED) 4 VIEWS TECHNIQUE: Fluoroscopic spot image(s) were submitted for interpretation post-operatively. Fluoroscopic time: 20 seconds. COMPARISON:  None. FINDINGS: Post total right hip replacement which appears in satisfactory position without complication noted on frontal imaging. Left hip joint degenerative changes. IMPRESSION: Post right hip replacement. Electronically Signed   By: Genia Del M.D.   On: 05/08/2018 13:42   Dg Hip Operative Unilat W Or W/o Pelvis Right  Result Date: 05/08/2018 CLINICAL DATA:  68 year old female post right hip replacement. Initial encounter. EXAM: OPERATIVE right HIP (WITH PELVIS IF PERFORMED) 4 VIEWS TECHNIQUE: Fluoroscopic spot  image(s) were submitted for interpretation post-operatively. Fluoroscopic time: 20 seconds. COMPARISON:  None. FINDINGS: Post total right hip replacement which appears in satisfactory position without complication noted on frontal imaging. Left hip joint degenerative changes. IMPRESSION: Post right hip replacement. Electronically Signed   By: Genia Del M.D.   On: 05/08/2018 13:42    Disposition: Discharge disposition: 01-Home or Self Care       Discharge Instructions    Call MD / Call 911   Complete by:  As directed    If you experience chest pain or shortness of breath, CALL 911 and be transported to the hospital  emergency room.  If you develope a fever above 101 F, pus (white drainage) or increased drainage or redness at the wound, or calf pain, call your surgeon's office.   Constipation Prevention   Complete by:  As directed    Drink plenty of fluids.  Prune juice may be helpful.  You may use a stool softener, such as Colace (over the counter) 100 mg twice a day.  Use MiraLax (over the counter) for constipation as needed.   Diet - low sodium heart healthy   Complete by:  As directed    Discharge instructions   Complete by:  As directed    INSTRUCTIONS AFTER JOINT REPLACEMENT   Remove items at home which could result in a fall. This includes throw rugs or furniture in walking pathways ICE to the affected joint every three hours while awake for 30 minutes at a time, for at least the first 3-5 days, and then as needed for pain and swelling.  Continue to use ice for pain and swelling. You may notice swelling that will progress down to the foot and ankle.  This is normal after surgery.  Elevate your leg when you are not up walking on it.   Continue to use the breathing machine you got in the hospital (incentive spirometer) which will help keep your temperature down.  It is common for your temperature to cycle up and down following surgery, especially at night when you are not up moving  around and exerting yourself.  The breathing machine keeps your lungs expanded and your temperature down.   DIET:  As you were doing prior to hospitalization, we recommend a well-balanced diet.  DRESSING / WOUND CARE / SHOWERING  You may shower 3 days after surgery, but keep the wounds dry during showering.  You may use an occlusive plastic wrap (Press'n Seal for example), NO SOAKING/SUBMERGING IN THE BATHTUB.  If the bandage gets wet, change with a clean dry gauze.  If the incision gets wet, pat the wound dry with a clean towel.  ACTIVITY  Increase activity slowly as tolerated, but follow the weight bearing instructions below.   No driving for 6 weeks or until further direction given by your physician.  You cannot drive while taking narcotics.  No lifting or carrying greater than 10 lbs. until further directed by your surgeon. Avoid periods of inactivity such as sitting longer than an hour when not asleep. This helps prevent blood clots.  You may return to work once you are authorized by your doctor.     WEIGHT BEARING   Weight bearing as tolerated with assist device (walker, cane, etc) as directed, use it as long as suggested by your surgeon or therapist, typically at least 4-6 weeks.   EXERCISES  Results after joint replacement surgery are often greatly improved when you follow the exercise, range of motion and muscle strengthening exercises prescribed by your doctor. Safety measures are also important to protect the joint from further injury. Any time any of these exercises cause you to have increased pain or swelling, decrease what you are doing until you are comfortable again and then slowly increase them. If you have problems or questions, call your caregiver or physical therapist for advice.   Rehabilitation is important following a joint replacement. After just a few days of immobilization, the muscles of the leg can become weakened and shrink (atrophy).  These exercises are  designed to build up the tone and strength of the thigh and leg muscles  and to improve motion. Often times heat used for twenty to thirty minutes before working out will loosen up your tissues and help with improving the range of motion but do not use heat for the first two weeks following surgery (sometimes heat can increase post-operative swelling).   These exercises can be done on a training (exercise) mat, on the floor, on a table or on a bed. Use whatever works the best and is most comfortable for you.    Use music or television while you are exercising so that the exercises are a pleasant break in your day. This will make your life better with the exercises acting as a break in your routine that you can look forward to.   Perform all exercises about fifteen times, three times per day or as directed.  You should exercise both the operative leg and the other leg as well.   Exercises include:   Quad Sets - Tighten up the muscle on the front of the thigh (Quad) and hold for 5-10 seconds.   Straight Leg Raises - With your knee straight (if you were given a brace, keep it on), lift the leg to 60 degrees, hold for 3 seconds, and slowly lower the leg.  Perform this exercise against resistance later as your leg gets stronger.  Leg Slides: Lying on your back, slowly slide your foot toward your buttocks, bending your knee up off the floor (only go as far as is comfortable). Then slowly slide your foot back down until your leg is flat on the floor again.  Angel Wings: Lying on your back spread your legs to the side as far apart as you can without causing discomfort.  Hamstring Strength:  Lying on your back, push your heel against the floor with your leg straight by tightening up the muscles of your buttocks.  Repeat, but this time bend your knee to a comfortable angle, and push your heel against the floor.  You may put a pillow under the heel to make it more comfortable if necessary.   A rehabilitation program  following joint replacement surgery can speed recovery and prevent re-injury in the future due to weakened muscles. Contact your doctor or a physical therapist for more information on knee rehabilitation.    CONSTIPATION  Constipation is defined medically as fewer than three stools per week and severe constipation as less than one stool per week.  Even if you have a regular bowel pattern at home, your normal regimen is likely to be disrupted due to multiple reasons following surgery.  Combination of anesthesia, postoperative narcotics, change in appetite and fluid intake all can affect your bowels.   YOU MUST use at least one of the following options; they are listed in order of increasing strength to get the job done.  They are all available over the counter, and you may need to use some, POSSIBLY even all of these options:    Drink plenty of fluids (prune juice may be helpful) and high fiber foods Colace 100 mg by mouth twice a day  Senokot for constipation as directed and as needed Dulcolax (bisacodyl), take with full glass of water  Miralax (polyethylene glycol) once or twice a day as needed.  If you have tried all these things and are unable to have a bowel movement in the first 3-4 days after surgery call either your surgeon or your primary doctor.    If you experience loose stools or diarrhea, hold the medications until you stool  forms back up.  If your symptoms do not get better within 1 week or if they get worse, check with your doctor.  If you experience "the worst abdominal pain ever" or develop nausea or vomiting, please contact the office immediately for further recommendations for treatment.   ITCHING:  If you experience itching with your medications, try taking only a single pain pill, or even half a pain pill at a time.  You can also use Benadryl over the counter for itching or also to help with sleep.   TED HOSE STOCKINGS:  Use stockings on both legs until for at least 2 weeks  or as directed by physician office. They may be removed at night for sleeping.  MEDICATIONS:  See your medication summary on the "After Visit Summary" that nursing will review with you.  You may have some home medications which will be placed on hold until you complete the course of blood thinner medication.  It is important for you to complete the blood thinner medication as prescribed.  PRECAUTIONS:  If you experience chest pain or shortness of breath - call 911 immediately for transfer to the hospital emergency department.   If you develop a fever greater that 101 F, purulent drainage from wound, increased redness or drainage from wound, foul odor from the wound/dressing, or calf pain - CONTACT YOUR SURGEON.                                                   FOLLOW-UP APPOINTMENTS:  If you do not already have a post-op appointment, please call the office for an appointment to be seen by your surgeon.  Guidelines for how soon to be seen are listed in your "After Visit Summary", but are typically between 1-4 weeks after surgery.  OTHER INSTRUCTIONS:   Knee Replacement:  Do not place pillow under knee, focus on keeping the knee straight while resting. CPM instructions: 0-90 degrees, 2 hours in the morning, 2 hours in the afternoon, and 2 hours in the evening. Place foam block, curve side up under heel at all times except when in CPM or when walking.  DO NOT modify, tear, cut, or change the foam block in any way.  MAKE SURE YOU:  Understand these instructions.  Get help right away if you are not doing well or get worse.    Thank you for letting us be a part of your medical care team.  It is a privilege we respect greatly.  We hope these instructions will help you stay on track for a fast and full recovery!   Increase activity slowly as tolerated   Complete by:  As directed       Follow-up Information    Melrose Nakayama, MD. Schedule an appointment as soon as possible for a visit in 2 weeks.    Specialty:  Orthopedic Surgery Contact information: [place] [address] [phone]            Signed: [name] Takeru Bose 05/10/2018, 7:10 AM

## 2018-05-10 NOTE — Care Management Note (Signed)
Case Management Note  Patient Details  Name: Waldine Zenz MRN: [id number] Date of Birth: Sep 28, 1950  Subjective/Objective:  68 yr old female s/p right total hip arthroplasty, anterior approach.                  Action/Plan: Case manager spoke with patient concerning discharge plan and DME. Patient was preoperatively setup with [facility], no changes. Has rolling walker.      Expected Discharge Date:  05/10/18               Expected Discharge Plan:  [place]  In-House Referral:  NA  Discharge planning Services  CM Consult  Post Acute Care Choice:  Home Health Choice offered to:  Patient  DME Arranged:  N/A(has RW) DME Agency:  NA  HH Arranged:  PT HH Agency:  NA  Status of Service:  Completed, signed off  If discussed at [place] of Stay Meetings, dates discussed:    Additional Comments:  Ninfa Meeker, RN 05/10/2018, 3:51 PM

## 2018-05-10 NOTE — Progress Notes (Addendum)
Occupational Therapy Evaluation Patient Details Name: Tracy Rasmussen MRN: [id number] DOB: January 07, 1951 Today's Date: 05/10/2018    History of Present Illness pt is a 68 y/o female with h/o spondylolisthesis, anxiety d/o presenting with R anterior hip DJD, pt s/p R Anterior approach THA.   Clinical Impression   PTA pt PLOF Mod I with use of Assistive devices. Pt lives in a small apartment with some assistance from family but not as much as recommended. Pt currently is Min Guard in functional transfers for safety and sequencing. Min A LB dressing and bathing due to pain and limited ROM. Pt will benefit from [place] to address ADLs in living environment for safety and learning of compensatory techniques. D/C to home setting with home health and supervision. Pt is in no need of further OT. Therapist signing off.     Follow Up Recommendations  Home health OT;Follow surgeon's recommendation for DC plan and follow-up therapies;Supervision - Intermittent(Pt is looking for more assist in home setting.)    Equipment Recommendations       Recommendations for Other Services       Precautions / Restrictions Precautions Precautions: Anterior Hip Precaution Booklet Issued: Yes (comment) Restrictions Weight Bearing Restrictions: Yes RLE Weight Bearing: Weight bearing as tolerated      Mobility Bed Mobility Overal bed mobility: Independent Bed Mobility: Supine to Sit     Supine to sit: Independent     General bed mobility comments: Pt demonstrated good sequence of bed mobs of supine to sit at EOB.   Transfers Overall transfer level: Needs assistance Equipment used: Rolling walker (2 wheeled) Transfers: Sit to/from Stand Sit to Stand: Min guard         General transfer comment: Pt demonstrate good sequence of sit to stand transition with verbal instruction and demonstration. Pt learned shower transfer with visual handout provided. VCs required sequecing and footl/leg  placment.    Balance Overall balance assessment: No apparent balance deficits (not formally assessed)                                         ADL either performed or assessed with clinical judgement   ADL Overall ADL's : Needs assistance/impaired Eating/Feeding: Independent   Grooming: Wash/dry face;Oral care;Modified independent   Upper Body Bathing: Independent;Sitting   Lower Body Bathing: Minimal assistance;Cueing for sequencing   Upper Body Dressing : Independent;Sitting   Lower Body Dressing: Minimal assistance   Toilet Transfer: Min guard;Ambulation;RW   Toileting- Clothing Manipulation and Hygiene: Supervision/safety;Sit to/from stand   Tub/ Shower Transfer: Minimal Museum/gallery conservator Details (indicate cue type and reason): learn tub transfer with handout provided. VCs for reminders of sequence and safety. Functional mobility during ADLs: Min guard;Rolling walker General ADL Comments: Pt requires assist with donning of RLE due to limitation and pain of R hip.      Vision Baseline Vision/History: Wears glasses       Perception     Praxis      Pertinent Vitals/Pain Pain Assessment: 0-10 Pain Score: 5  Pain Location: hip Pain Descriptors / Indicators: Aching;Sore;Grimacing;Guarding Pain Intervention(s): Monitored during session     Hand Dominance     Extremity/Trunk Assessment Upper Extremity Assessment Upper Extremity Assessment: Overall WFL for tasks assessed   Lower Extremity Assessment Lower Extremity Assessment: Defer to PT evaluation RLE Deficits / Details: painful and weak, grossly 4-/5  Communication Communication Communication: No difficulties   Cognition Arousal/Alertness: Awake/alert Behavior During Therapy: WFL for tasks assessed/performed Overall Cognitive Status: Within Functional Limits for tasks assessed                                     General Comments       Exercises      Shoulder Instructions      Home Living Family/patient expects to be discharged to:: Private residence Living Arrangements: Alone Available Help at Discharge: Other (Comment)(She has no one, but hopes to go straight home) Type of Home: Apartment Home Access: Stairs to enter [employer] of Steps: 8+8 Entrance Stairs-Rails: Right;Left;Can reach both Home Layout: One level     Bathroom Shower/Tub: Teacher, early years/pre: Handicapped height Bathroom Accessibility: Yes   Home Equipment: Cane - single point;Grab bars - tub/shower(3 n 1)   Additional Comments: Pt reports having a 3n1 but bathroom is really small. She reports it may be difficult to maneuver with walker.      Prior Functioning/Environment Level of Independence: Independent with assistive device(s)        Comments: uses cane for functional mobility        OT Problem List: Impaired balance (sitting and/or standing);Decreased safety awareness;Decreased knowledge of use of DME or AE;Decreased knowledge of precautions      OT Treatment/Interventions:      OT Goals(Current goals can be found in the care plan section) Acute Rehab OT Goals Patient Stated Goal: go home directly OT Goal Formulation: With patient Time For Goal Achievement: 05/10/18 Potential to Achieve Goals: Good ADL Goals Pt Will Perform Lower Body Bathing: with modified independence;with adaptive equipment;sit to/from stand Pt Will Perform Lower Body Dressing: with modified independence;with adaptive equipment;bed level Pt Will Transfer to Toilet: with modified independence;ambulating;bedside commode Pt Will Perform Toileting - Clothing Manipulation and hygiene: with modified independence;with adaptive equipment;sit to/from stand Pt Will Perform Tub/Shower Transfer: Tub transfer;3 in 1;rolling walker;ambulating  OT Frequency:     Barriers to D/C:            Co-evaluation              AM-PAC OT "6 Clicks" Daily  Activity     Outcome Measure Help from another person eating meals?: None Help from another person taking care of personal grooming?: None Help from another person toileting, which includes using toliet, bedpan, or urinal?: None Help from another person bathing (including washing, rinsing, drying)?: A Little Help from another person to put on and taking off regular upper body clothing?: None Help from another person to put on and taking off regular lower body clothing?: A Lot 6 Click Score: 21   End of Session Equipment Utilized During Treatment: Gait belt;Rolling walker Nurse Communication: Mobility status;Weight bearing status  Activity Tolerance: Patient tolerated treatment well Patient left: in chair;with call bell/phone within reach  OT Visit Diagnosis: Unsteadiness on feet (R26.81)                Time: 0930-1004 OT Time Calculation (min): 34 min Charges:  OT General Charges $OT Visit: 1 Visit OT Evaluation $OT Eval Moderate Complexity: 1 Mod OT Treatments $Self Care/Home Management : 8-22 mins Minus Breeding, MSOT, OTR/L  Supplemental Rehabilitation Services  [phone]  Marius Ditch 05/10/2018, 11:14 AM

## 2018-05-10 NOTE — Progress Notes (Signed)
Physical Therapy Treatment Patient Details Name: Tracy Rasmussen MRN: [id number] DOB: Jan 06, 1951 Today's Date: 05/10/2018    History of Present Illness pt is a 68 y/o female with h/o spondylolisthesis, anxiety d/o presenting with R anterior hip DJD, pt s/p R direct anterior approach THA.    PT Comments    Patient seen for mobility progression. Pt is progressing well toward PT goals and tolerated increased gait distance, stair training, and therex well. Continue to progress as tolerated.    Follow Up Recommendations  Home health PT;Follow surgeon's recommendation for DC plan and follow-up therapies     Equipment Recommendations  None recommended by PT    Recommendations for Other Services       Precautions / Restrictions Restrictions Weight Bearing Restrictions: Yes RLE Weight Bearing: Weight bearing as tolerated    Mobility  Bed Mobility Overal bed mobility: Independent                Transfers Overall transfer level: Modified independent Equipment used: Rolling walker (2 wheeled) Transfers: Sit to/from Stand              Ambulation/Gait Ambulation/Gait assistance: Supervision Gait Distance (Feet): 300 Feet Assistive device: Rolling walker (2 wheeled) Gait Pattern/deviations: Step-through pattern Gait velocity: decreased   General Gait Details: cues for posture and R heel strike initially   Stairs   Stairs assistance: Min guard Stair Management: One rail Right;Forwards;With walker;With cane;Step to pattern;Sideways(with folded RW in L hand)   General stair comments: practiced stairs sideways with R rail, forward with R rail and SPC, and forward with rail and RW; overall min guard for safety; pt feels safer using rail and SPC    Wheelchair Mobility    Modified Rankin (Stroke Patients Only)       Balance                                            Cognition Arousal/Alertness: Awake/alert Behavior During Therapy: WFL for  tasks assessed/performed Overall Cognitive Status: Within Functional Limits for tasks assessed                                        Exercises Total Joint Exercises Short Arc Quad: AROM;Strengthening;Right;10 reps Hip ABduction/ADduction: AROM;Strengthening;Right;10 reps;Standing Knee Flexion: AROM;Strengthening;Right;Standing Marching in Standing: AROM;Strengthening;Right;Standing    General Comments        Pertinent Vitals/Pain Pain Assessment: Faces Faces Pain Scale: Hurts a little bit Pain Location: hip Pain Descriptors / Indicators: Sore;Guarding Pain Intervention(s): Limited activity within patient's tolerance;Monitored during session;Premedicated before session;Repositioned    Home Living                      Prior Function            PT Goals (current goals can now be found in the care plan section) Acute Rehab PT Goals Patient Stated Goal: go home directly Progress towards PT goals: Progressing toward goals    Frequency    7X/week      PT Plan Current plan remains appropriate;Frequency needs to be updated    Co-evaluation              AM-PAC PT "6 Clicks" Mobility   Outcome Measure  Help needed turning from your back to your side while  in a flat bed without using bedrails?: None Help needed moving from lying on your back to sitting on the side of a flat bed without using bedrails?: None Help needed moving to and from a bed to a chair (including a wheelchair)?: None Help needed standing up from a chair using your arms (e.g., wheelchair or bedside chair)?: None Help needed to walk in hospital room?: None Help needed climbing 3-5 steps with a railing? : A Little 6 Click Score: 23    End of Session Equipment Utilized During Treatment: Gait belt Activity Tolerance: Patient tolerated treatment well Patient left: in chair;with call bell/phone within reach Nurse Communication: Mobility status PT Visit Diagnosis: Other  abnormalities of gait and mobility (R26.89);Pain Pain - Right/Left: Right Pain - part of body: Hip     Time: 1017-5102 PT Time Calculation (min) (ACUTE ONLY): 34 min  Charges:  $Gait Training: 8-22 mins $Therapeutic Exercise: 8-22 mins                     Earney Navy, PTA Acute Rehabilitation Services Pager: [phone] Office: [phone]     Darliss Cheney 05/10/2018, 4:00 PM

## 2018-05-10 NOTE — Progress Notes (Signed)
Subjective: 2 Days Post-Op Procedure(s) (LRB): TOTAL HIP ARTHROPLASTY ANTERIOR APPROACH (Right) patient feels great and wants to go home today. A friend can pick her up after work she states.  Activity level:  wbat Diet tolerance:  ok Voiding:  ok Patient reports pain as mild.    Objective: Vital signs in last 24 hours: Temp:  [98.2 F (36.8 C)-100.3 F (37.9 C)] 98.2 F (36.8 C) (01/30 0341) Pulse Rate:  [60-74] 60 (01/30 0341) Resp:  [18-19] 18 (01/30 0341) BP: (115-136)/(61-69) 115/61 (01/30 0341) SpO2:  [95 %-99 %] 95 % (01/30 0341)  Labs: Recent Labs    05/08/18 1104  HGB 12.5   Recent Labs    05/08/18 1104  WBC 8.9  RBC 4.73  HCT 41.5  PLT 290   No results for input(s): NA, K, CL, CO2, BUN, CREATININE, GLUCOSE, CALCIUM in the last 72 hours. No results for input(s): LABPT, INR in the last 72 hours.  Physical Exam:  Neurologically intact ABD soft Neurovascular intact Sensation intact distally Intact pulses distally Dorsiflexion/Plantar flexion intact Incision: dressing C/D/I and no drainage No cellulitis present Compartment soft  Assessment/Plan:  2 Days Post-Op Procedure(s) (LRB): TOTAL HIP ARTHROPLASTY ANTERIOR APPROACH (Right) Advance diet Up with therapy Discharge home with home health today after PT  Continue on ASA 325mg  BID x 4 weeks post op. Follow up in office 2 weeks post op.   Tracy Rasmussen 05/10/2018, 7:07 AM

## 2018-05-11 DIAGNOSIS — M4316 Spondylolisthesis, lumbar region: Secondary | ICD-10-CM | POA: Diagnosis not present

## 2018-05-11 DIAGNOSIS — Z8572 Personal history of non-Hodgkin lymphomas: Secondary | ICD-10-CM | POA: Diagnosis not present

## 2018-05-11 DIAGNOSIS — Z87891 Personal history of nicotine dependence: Secondary | ICD-10-CM | POA: Diagnosis not present

## 2018-05-11 DIAGNOSIS — F419 Anxiety disorder, unspecified: Secondary | ICD-10-CM | POA: Diagnosis not present

## 2018-05-11 DIAGNOSIS — E782 Mixed hyperlipidemia: Secondary | ICD-10-CM | POA: Diagnosis not present

## 2018-05-11 DIAGNOSIS — E559 Vitamin D deficiency, unspecified: Secondary | ICD-10-CM | POA: Diagnosis not present

## 2018-05-11 DIAGNOSIS — Z471 Aftercare following joint replacement surgery: Secondary | ICD-10-CM | POA: Diagnosis not present

## 2018-05-11 DIAGNOSIS — Z96641 Presence of right artificial hip joint: Secondary | ICD-10-CM | POA: Diagnosis not present

## 2018-05-11 DIAGNOSIS — M8580 Other specified disorders of bone density and structure, unspecified site: Secondary | ICD-10-CM | POA: Diagnosis not present

## 2018-05-15 DIAGNOSIS — Z471 Aftercare following joint replacement surgery: Secondary | ICD-10-CM | POA: Diagnosis not present

## 2018-05-15 DIAGNOSIS — E559 Vitamin D deficiency, unspecified: Secondary | ICD-10-CM | POA: Diagnosis not present

## 2018-05-15 DIAGNOSIS — Z87891 Personal history of nicotine dependence: Secondary | ICD-10-CM | POA: Diagnosis not present

## 2018-05-15 DIAGNOSIS — M4316 Spondylolisthesis, lumbar region: Secondary | ICD-10-CM | POA: Diagnosis not present

## 2018-05-15 DIAGNOSIS — M8580 Other specified disorders of bone density and structure, unspecified site: Secondary | ICD-10-CM | POA: Diagnosis not present

## 2018-05-15 DIAGNOSIS — Z8572 Personal history of non-Hodgkin lymphomas: Secondary | ICD-10-CM | POA: Diagnosis not present

## 2018-05-15 DIAGNOSIS — E782 Mixed hyperlipidemia: Secondary | ICD-10-CM | POA: Diagnosis not present

## 2018-05-15 DIAGNOSIS — Z96641 Presence of right artificial hip joint: Secondary | ICD-10-CM | POA: Diagnosis not present

## 2018-05-15 DIAGNOSIS — F419 Anxiety disorder, unspecified: Secondary | ICD-10-CM | POA: Diagnosis not present

## 2018-05-18 DIAGNOSIS — M1611 Unilateral primary osteoarthritis, right hip: Secondary | ICD-10-CM | POA: Diagnosis not present

## 2018-05-24 DIAGNOSIS — Z96641 Presence of right artificial hip joint: Secondary | ICD-10-CM | POA: Diagnosis not present

## 2018-05-24 DIAGNOSIS — M25551 Pain in right hip: Secondary | ICD-10-CM | POA: Diagnosis not present

## 2018-05-24 DIAGNOSIS — M25651 Stiffness of right hip, not elsewhere classified: Secondary | ICD-10-CM | POA: Diagnosis not present

## 2018-05-31 DIAGNOSIS — M25551 Pain in right hip: Secondary | ICD-10-CM | POA: Diagnosis not present

## 2018-05-31 DIAGNOSIS — M25651 Stiffness of right hip, not elsewhere classified: Secondary | ICD-10-CM | POA: Diagnosis not present

## 2018-05-31 DIAGNOSIS — Z96641 Presence of right artificial hip joint: Secondary | ICD-10-CM | POA: Diagnosis not present

## 2018-06-02 DIAGNOSIS — M25551 Pain in right hip: Secondary | ICD-10-CM | POA: Diagnosis not present

## 2018-06-04 DIAGNOSIS — M25651 Stiffness of right hip, not elsewhere classified: Secondary | ICD-10-CM | POA: Diagnosis not present

## 2018-06-04 DIAGNOSIS — Z96641 Presence of right artificial hip joint: Secondary | ICD-10-CM | POA: Diagnosis not present

## 2018-06-04 DIAGNOSIS — M25551 Pain in right hip: Secondary | ICD-10-CM | POA: Diagnosis not present

## 2018-06-06 DIAGNOSIS — Z96641 Presence of right artificial hip joint: Secondary | ICD-10-CM | POA: Diagnosis not present

## 2018-06-06 DIAGNOSIS — M25651 Stiffness of right hip, not elsewhere classified: Secondary | ICD-10-CM | POA: Diagnosis not present

## 2018-06-06 DIAGNOSIS — M25551 Pain in right hip: Secondary | ICD-10-CM | POA: Diagnosis not present

## 2018-06-14 DIAGNOSIS — Z96641 Presence of right artificial hip joint: Secondary | ICD-10-CM | POA: Diagnosis not present

## 2018-06-14 DIAGNOSIS — M25651 Stiffness of right hip, not elsewhere classified: Secondary | ICD-10-CM | POA: Diagnosis not present

## 2018-06-14 DIAGNOSIS — M25551 Pain in right hip: Secondary | ICD-10-CM | POA: Diagnosis not present

## 2018-06-18 DIAGNOSIS — E782 Mixed hyperlipidemia: Secondary | ICD-10-CM | POA: Diagnosis not present

## 2018-06-18 DIAGNOSIS — F419 Anxiety disorder, unspecified: Secondary | ICD-10-CM | POA: Diagnosis not present

## 2018-06-18 DIAGNOSIS — E663 Overweight: Secondary | ICD-10-CM | POA: Diagnosis not present

## 2018-06-18 DIAGNOSIS — M4726 Other spondylosis with radiculopathy, lumbar region: Secondary | ICD-10-CM | POA: Diagnosis not present

## 2018-06-18 DIAGNOSIS — R739 Hyperglycemia, unspecified: Secondary | ICD-10-CM | POA: Diagnosis not present

## 2018-06-18 DIAGNOSIS — H6123 Impacted cerumen, bilateral: Secondary | ICD-10-CM | POA: Diagnosis not present

## 2018-06-18 DIAGNOSIS — Z0000 Encounter for general adult medical examination without abnormal findings: Secondary | ICD-10-CM | POA: Diagnosis not present

## 2018-06-19 DIAGNOSIS — M25651 Stiffness of right hip, not elsewhere classified: Secondary | ICD-10-CM | POA: Diagnosis not present

## 2018-06-19 DIAGNOSIS — M25551 Pain in right hip: Secondary | ICD-10-CM | POA: Diagnosis not present

## 2018-06-19 DIAGNOSIS — Z96641 Presence of right artificial hip joint: Secondary | ICD-10-CM | POA: Diagnosis not present

## 2018-06-20 DIAGNOSIS — Z6828 Body mass index (BMI) 28.0-28.9, adult: Secondary | ICD-10-CM | POA: Diagnosis not present

## 2018-06-20 DIAGNOSIS — R030 Elevated blood-pressure reading, without diagnosis of hypertension: Secondary | ICD-10-CM | POA: Diagnosis not present

## 2018-06-20 DIAGNOSIS — M4316 Spondylolisthesis, lumbar region: Secondary | ICD-10-CM | POA: Diagnosis not present

## 2018-06-21 DIAGNOSIS — Z96641 Presence of right artificial hip joint: Secondary | ICD-10-CM | POA: Diagnosis not present

## 2018-06-21 DIAGNOSIS — M25551 Pain in right hip: Secondary | ICD-10-CM | POA: Diagnosis not present

## 2018-06-21 DIAGNOSIS — M25651 Stiffness of right hip, not elsewhere classified: Secondary | ICD-10-CM | POA: Diagnosis not present

## 2018-06-26 DIAGNOSIS — M25651 Stiffness of right hip, not elsewhere classified: Secondary | ICD-10-CM | POA: Diagnosis not present

## 2018-06-26 DIAGNOSIS — Z96641 Presence of right artificial hip joint: Secondary | ICD-10-CM | POA: Diagnosis not present

## 2018-06-26 DIAGNOSIS — M25551 Pain in right hip: Secondary | ICD-10-CM | POA: Diagnosis not present

## 2018-06-28 DIAGNOSIS — Z96641 Presence of right artificial hip joint: Secondary | ICD-10-CM | POA: Diagnosis not present

## 2018-06-28 DIAGNOSIS — M25551 Pain in right hip: Secondary | ICD-10-CM | POA: Diagnosis not present

## 2018-06-28 DIAGNOSIS — M25651 Stiffness of right hip, not elsewhere classified: Secondary | ICD-10-CM | POA: Diagnosis not present

## 2018-07-03 DIAGNOSIS — M25551 Pain in right hip: Secondary | ICD-10-CM | POA: Diagnosis not present

## 2018-07-03 DIAGNOSIS — M25651 Stiffness of right hip, not elsewhere classified: Secondary | ICD-10-CM | POA: Diagnosis not present

## 2018-07-03 DIAGNOSIS — Z96641 Presence of right artificial hip joint: Secondary | ICD-10-CM | POA: Diagnosis not present

## 2018-07-05 DIAGNOSIS — M25551 Pain in right hip: Secondary | ICD-10-CM | POA: Diagnosis not present

## 2018-07-05 DIAGNOSIS — Z96641 Presence of right artificial hip joint: Secondary | ICD-10-CM | POA: Diagnosis not present

## 2018-07-05 DIAGNOSIS — M25651 Stiffness of right hip, not elsewhere classified: Secondary | ICD-10-CM | POA: Diagnosis not present

## 2018-07-19 ENCOUNTER — Other Ambulatory Visit: Payer: Self-pay | Admitting: Cardiovascular Disease

## 2018-07-20 ENCOUNTER — Telehealth: Payer: Self-pay

## 2018-07-20 NOTE — Telephone Encounter (Signed)
Patient is due for a 1 year f/u. Left message for patient to call back. Will see if patient is interested in a virtual/phone visit.

## 2018-07-24 NOTE — Progress Notes (Signed)
Virtual Visit via Video Note   This visit type was conducted due to national recommendations for restrictions regarding the COVID-19 Pandemic (e.g. social distancing) in an effort to limit this patient's exposure and mitigate transmission in our community.  Due to her co-morbid illnesses, this patient is at least at moderate risk for complications without adequate follow up.  This format is felt to be most appropriate for this patient at this time.  All issues noted in this document were discussed and addressed.  A limited physical exam was performed with this format.  Please refer to the patient's chart for her consent to telehealth for [facility]   Unable to do video link with [email or web address] had to change to phone visit   Evaluation Performed:  Follow-up visit  Date:  07/26/2018   ID:  Tracy Rasmussen, DOB 03-28-1951, MRN [id number]  Patient Location: Home  Provider Location: Office  PCP:  Kathyrn Lass, MD  Cardiologist:  Johnsie Cancel Electrophysiologist:  None   Chief Complaint:  HLD  History of Present Illness:    Tracy Rasmussen is a 68 y.o. female who presents via audio/video conferencing for a telehealth visit today.    Patient has had Rx for lipids since age 13 She has had issues with depression estranged from sister in [place]. Activity limited by arthritis. Had right THR done January 2020 Has been Rx with lipids with statin and fibrates  Furlowed from [place] alone Walking around neighborhood   The patient does not have symptoms concerning for COVID-19 infection (fever, chills, cough, or new shortness of breath).    Past Medical History:  Diagnosis Date  . Anxiety disorder   . Complication of anesthesia   . Mixed hyperlipidemia   . Osteopenia   . Over weight   . PONV (postoperative nausea and vomiting)   . Pulmonary nodules   . Small lymphocytic B-cell lymphoma involving skin ([place])   . Spondylolisthesis of lumbar region   . Vitamin D deficiency    Past Surgical  History:  Procedure Laterality Date  . BACK SURGERY  1994   L5, S1 herniated disc  . BACK SURGERY     Herniated Disc 2018  . BACK SURGERY     Fusion L4 & L5, Herniated Disc 2019  . BREAST BIOPSY  2006   benign  . CRYOTHERAPY  1980's  . TOTAL HIP ARTHROPLASTY Right 05/08/2018   Procedure: TOTAL HIP ARTHROPLASTY ANTERIOR APPROACH;  Surgeon: Melrose Nakayama, MD;  Location: [place];  Service: Orthopedics;  Laterality: Right;  . WISDOM TOOTH EXTRACTION     teenage years     Current Meds  Medication Sig  . acetaminophen (TYLENOL) 500 MG tablet Take 1,000 mg by mouth at bedtime as needed for mild pain, moderate pain, fever or headache.   . ALPRAZolam (XANAX) 0.25 MG tablet Take 0.25 mg by mouth at bedtime.   Marland Kitchen atorvastatin (LIPITOR) 80 MG tablet Take 1 tablet (80 mg total) by mouth daily. Please make annual appt with Dr. Johnsie Cancel for future refills. Thank you  . Calcium-Vitamin D-Vitamin K [id number] MG-UNT-MCG CHEW Chew 2 each by mouth every morning.  . cetirizine (ZYRTEC) 10 MG tablet Take 10 mg by mouth at bedtime.   . Cholecalciferol (VITAMIN D3) 5000 units CAPS Take 5,000 Units by mouth daily after breakfast.  . citalopram (CELEXA) 20 MG tablet Take 20 mg by mouth daily.  . fenofibrate 160 MG tablet Take 1 tablet (160 mg total) by mouth daily.  . Melatonin 3 MG  CAPS Take 3 mg by mouth at bedtime.  . MULTIPLE VITAMIN PO Take 2 tablets by mouth daily after breakfast.   . omega-3 acid ethyl esters (LOVAZA) 1 g capsule Take 2 capsules (2 g total) by mouth 2 (two) times daily. (Patient taking differently: Take 4 g by mouth daily. )  . omega-3 acid ethyl esters (LOVAZA) 1 g capsule Take 4 g by mouth daily.  Marland Kitchen omeprazole (PRILOSEC) 20 MG capsule Take 20 mg by mouth every morning.   . traMADol (ULTRAM) 50 MG tablet Take 50-100 mg by mouth every 6 (six) hours as needed for moderate pain.      Allergies:   Iodine and Ivp dye [iodinated diagnostic agents]   Social History   Tobacco Use  .  Smoking status: Former Research scientist (life sciences)  . Smokeless tobacco: Former Systems developer  . Tobacco comment: quit 2005  Substance Use Topics  . Alcohol use: Yes    Alcohol/week: 0.0 standard drinks    Comment: social  . Drug use: No     Family Hx: The patient's family history includes Kidney disease in her father; Pancreatic cancer in her mother; Thyroid disease in her sister. There is no history of Colon cancer, Colon polyps, or Liver disease.  ROS:   Please see the history of present illness.     All other systems reviewed and are negative.   Prior CV studies:   The following studies were reviewed today:  None  Labs/Other Tests and Data Reviewed:    EKG: 06/14/17 NSR rate 67 normal ECG   Recent Labs: 03/07/2018: ALT 26 04/30/2018: BUN 20; Creatinine, Ser 0.96; Potassium 3.9; Sodium 140 05/08/2018: Hemoglobin 12.5; Platelets 290   Recent Lipid Panel Lab Results  Component Value Date/Time   CHOL 156 03/07/2018 10:08 AM   TRIG 328 (H) 03/07/2018 10:08 AM   HDL 36 (L) 03/07/2018 10:08 AM   CHOLHDL 4.3 03/07/2018 10:08 AM   CHOLHDL 5.5 (H) 02/15/2016 07:33 AM   LDLCALC 54 03/07/2018 10:08 AM   LDLDIRECT 56 12/19/2016 07:51 AM   LDLDIRECT 86 08/25/2015 07:46 AM    Wt Readings from Last 3 Encounters:  07/26/18 73.9 kg  05/08/18 72.1 kg  04/30/18 72.3 kg     Objective:    Vital Signs:  Ht 5\' 3"  (1.6 m)   Wt 73.9 kg   BMI 28.87 kg/m    No exam   ASSESSMENT & PLAN:    1. HLD:  LDL 54 03/07/18 Triglycerides 328 on fibrates  F/u lipid clinic  Consider starting Vascepa 1 gram bid  2. Ortho:  Post THR on right PT/OT Tramadol for pain  3. Anemia:  Post op transfusion Hct now stagble 46.4 05/08/18  COVID-19 Education: The signs and symptoms of COVID-19 were discussed with the patient and how to seek care for testing (follow up with PCP or arrange E-visit).  The importance of social distancing was discussed today.  Time:   Today, I have spent 30 minutes with the patient with telehealth  technology discussing the above problems.     Medication Adjustments/Labs and Tests Ordered: Current medicines are reviewed at length with the patient today.  Concerns regarding medicines are outlined above.   Tests Ordered:  Lipid and liver in 6 months   Medication Changes: No orders of the defined types were placed in this encounter.   Disposition:  Follow up lipid clinic f/u with me in a year   Signed, Jenkins Rouge, MD  07/26/2018 10:05 AM    [facility]

## 2018-07-24 NOTE — Telephone Encounter (Signed)
Virtual Visit Pre-Appointment Phone Call  Steps For Call:  Confirm consent - "In the Rasmussen of the current Covid19 crisis, you are scheduled for a (phone or video) visit with your provider on (date) at (time).  Just as we do with many in-office visits, in order for you to participate in this visit, we must obtain consent.  If you'd like, I can send this to your mychart (if signed up) or email for you to review.  Otherwise, I can obtain your verbal consent now.  All virtual visits are billed to your insurance company just like a normal visit would be.  By agreeing to a virtual visit, we'd like you to understand that the technology does not allow for your provider to perform an examination, and thus may limit your provider's ability to fully assess your condition.  Finally, though the technology is pretty good, we cannot assure that it will always work on either your or our end, and in the Rasmussen of a video visit, we may have to convert it to a phone-only visit.  In either situation, we cannot ensure that we have a secure connection.  Are you willing to proceed?" STAFF: Did the patient verbally acknowledge consent to telehealth visit? YES on by phone  1. Confirm the BEST phone number to call the day of the visit: [phone]  2. Give patient instructions for WebEx/MyChart download to smartphone as below or Doximity/[email or web address] if video visit (depending on what platform provider is using)  3. Advise patient to be prepared with any vital sign or heart rhythm information, their current medicines, and a piece of paper and pen handy for any instructions they may receive the day of their visit  4. Inform patient they will receive a phone call 15 minutes prior to their appointment time (may be from unknown caller ID) so they should be prepared to answer  5. Confirm that appointment type is correct in Epic appointment notes (video vs telephone)     TELEPHONE CALL NOTE  Tracy Rasmussen has been  deemed a candidate for a follow-up tele-health visit to limit community exposure during the Covid-19 pandemic. I spoke with the patient via phone to ensure availability of phone/video source, confirm preferred email & phone number, and discuss instructions and expectations.  I reminded Tracy Rasmussen to be prepared with any vital sign and/or heart rhythm information that could potentially be obtained via home monitoring, at the time of her visit. I reminded Tracy Rasmussen to expect a phone call at the time of her visit if her visit.  [place], RN 07/24/2018 2:46 PM   DOWNLOADING THE [place] APP TO SMARTPHONE  - If Apple, ask patient to go to App Store and type in WebEx in the search bar. [employer], the blue/green circle. If Android, go to [employer] and type in [employer] in the search bar. The app is free but as with any other app downloads, their phone may require them to verify saved payment information or Apple/Android password.  - The patient does NOT have to create an account. - On the day of the visit, the assist will walk the patient through joining the meeting with the meeting number/password.  DOWNLOADING THE MYCHART APP TO SMARTPHONE  - If Apple, go to [employer] and type in MyChart in the search bar and download the app. If Android, ask patient to go to [employer] and type in [place] in the search bar and download the  app. The app is free but as with any other app downloads, their phone may require them to verify saved payment information or Apple/Android password.  - The patient will need to then log into the app with their MyChart username and password, and select [facility] as their healthcare provider to link the account. When it is time for your visit, go to the MyChart app, find appointments, and click Begin Video Visit. Be sure to Select Allow for your device to access the Microphone and Camera for your visit. You will then be connected,  and your provider will be with you shortly.  **If they have any issues connecting, or need assistance please contact [place] [phone])**  **If using a computer, in order to ensure the best quality for your visit they will need to use either of the following Internet Browsers: Microsoft [place], or Google Chrome**  [place]   I hereby voluntarily request, consent and authorize [facility] and its employed or contracted physicians, physician assistants, nurse practitioners or other licensed health care professionals (the Practitioner), to provide me with telemedicine health care services (the Services") as deemed necessary by the treating Practitioner. I acknowledge and consent to receive the Services by the Practitioner via telemedicine. I understand that the telemedicine visit will involve communicating with the Practitioner through live audiovisual communication technology and the disclosure of certain medical information by electronic transmission. I acknowledge that I have been given the opportunity to request an in-person assessment or other available alternative prior to the telemedicine visit and am voluntarily participating in the telemedicine visit.  I understand that I have the right to withhold or withdraw my consent to the use of telemedicine in the course of my care at any time, without affecting my right to future care or treatment, and that the Practitioner or I may terminate the telemedicine visit at any time. I understand that I have the right to inspect all information obtained and/or recorded in the course of the telemedicine visit and may receive copies of available information for a reasonable fee.  I understand that some of the potential risks of receiving the Services via telemedicine include:   Delay or interruption in medical evaluation due to technological equipment failure or disruption;  Information transmitted  may not be sufficient (e.g. poor resolution of images) to allow for appropriate medical decision making by the Practitioner; and/or   In rare instances, security protocols could fail, causing a breach of personal health information.  Furthermore, I acknowledge that it is my responsibility to provide information about my medical history, conditions and care that is complete and accurate to the best of my ability. I acknowledge that Practitioner's advice, recommendations, and/or decision may be based on factors not within their control, such as incomplete or inaccurate data provided by me or distortions of diagnostic images or specimens that may result from electronic transmissions. I understand that the practice of medicine is not an exact science and that Practitioner makes no warranties or guarantees regarding treatment outcomes. I acknowledge that I will receive a copy of this consent concurrently upon execution via email to the email address I last provided but may also request a printed copy by calling the office of [facility].    I understand that my insurance will be billed for this visit.   I have read or had this consent read to me.  I understand the contents of this consent, which adequately explains the benefits and  risks of the Services being provided via telemedicine.   I have been provided ample opportunity to ask questions regarding this consent and the Services and have had my questions answered to my satisfaction.  I give my informed consent for the services to be provided through the use of telemedicine in my medical care  By participating in this telemedicine visit I agree to the above.

## 2018-07-26 ENCOUNTER — Other Ambulatory Visit: Payer: Self-pay

## 2018-07-26 ENCOUNTER — Telehealth (INDEPENDENT_AMBULATORY_CARE_PROVIDER_SITE_OTHER): Payer: PPO | Admitting: Cardiovascular Disease

## 2018-07-26 VITALS — Ht 63.0 in | Wt 163.0 lb

## 2018-07-26 DIAGNOSIS — E782 Mixed hyperlipidemia: Secondary | ICD-10-CM | POA: Diagnosis not present

## 2018-07-26 NOTE — Patient Instructions (Addendum)
Medication Instructions:   If you need a refill on your cardiac medications before your next appointment, please call your pharmacy.   Lab work: Your physician recommends that you return for lab work in: 6 months for lipid and liver panel.  If you have labs (blood work) drawn today and your tests are completely normal, you will receive your results only by: Marland Kitchen MyChart Message (if you have MyChart) OR . A paper copy in the mail If you have any lab test that is abnormal or we need to change your treatment, we will call you to review the results.  Testing/Procedures: None ordered today.  Follow-Up: At [facility], you and your health needs are our priority.  As part of our continuing mission to provide you with exceptional heart care, we have created designated Provider Care Teams.  These Care Teams include your primary Cardiologist (physician) and Advanced Practice Providers (APPs -  Physician Assistants and Nurse Practitioners) who all work together to provide you with the care you need, when you need it. You will need a follow up appointment in 12  months.  Please call our office 2 months in advance to schedule this appointment.  You may see Dr. Johnsie Cancel or one of the following Advanced Practice Providers on your designated Care Team:   Truitt Merle, NP Cecilie Kicks, NP . Kathyrn Drown, NP  Your physician recommends that you schedule a follow-up appointment in: 6 month with lipid clinic.  Someone from our office will call you with an appointment for lab work and lipid clinic appointment.

## 2018-08-06 ENCOUNTER — Telehealth: Payer: Self-pay

## 2018-08-06 DIAGNOSIS — E785 Hyperlipidemia, unspecified: Secondary | ICD-10-CM

## 2018-08-06 NOTE — Telephone Encounter (Signed)
-----   Message from Earnestine Mealing sent at 08/06/2018  1:55 PM EDT ----- Need order for labs please ----- Message ----- From: Michaelyn Barter, RN Sent: 07/27/2018   8:30 AM EDT To: Chauncey Reading Price  Needs lipid clinic appt and fasting lipid and liver panel (labs) in 6 months

## 2018-08-08 DIAGNOSIS — M25551 Pain in right hip: Secondary | ICD-10-CM | POA: Diagnosis not present

## 2018-08-24 DIAGNOSIS — Z1231 Encounter for screening mammogram for malignant neoplasm of breast: Secondary | ICD-10-CM | POA: Diagnosis not present

## 2018-08-29 ENCOUNTER — Other Ambulatory Visit: Payer: Self-pay | Admitting: Cardiovascular Disease

## 2018-09-19 DIAGNOSIS — M4316 Spondylolisthesis, lumbar region: Secondary | ICD-10-CM | POA: Diagnosis not present

## 2018-10-22 ENCOUNTER — Other Ambulatory Visit: Payer: Self-pay | Admitting: Cardiovascular Disease

## 2018-10-31 DIAGNOSIS — Z96641 Presence of right artificial hip joint: Secondary | ICD-10-CM | POA: Diagnosis not present

## 2018-10-31 DIAGNOSIS — M25551 Pain in right hip: Secondary | ICD-10-CM | POA: Diagnosis not present

## 2018-10-31 DIAGNOSIS — Z471 Aftercare following joint replacement surgery: Secondary | ICD-10-CM | POA: Diagnosis not present

## 2018-11-05 DIAGNOSIS — J0100 Acute maxillary sinusitis, unspecified: Secondary | ICD-10-CM | POA: Diagnosis not present

## 2018-11-05 DIAGNOSIS — J309 Allergic rhinitis, unspecified: Secondary | ICD-10-CM | POA: Diagnosis not present

## 2018-12-19 DIAGNOSIS — F419 Anxiety disorder, unspecified: Secondary | ICD-10-CM | POA: Diagnosis not present

## 2018-12-19 DIAGNOSIS — G4700 Insomnia, unspecified: Secondary | ICD-10-CM | POA: Diagnosis not present

## 2018-12-26 DIAGNOSIS — J0101 Acute recurrent maxillary sinusitis: Secondary | ICD-10-CM | POA: Diagnosis not present

## 2019-01-08 DIAGNOSIS — J0101 Acute recurrent maxillary sinusitis: Secondary | ICD-10-CM | POA: Diagnosis not present

## 2019-01-08 DIAGNOSIS — L905 Scar conditions and fibrosis of skin: Secondary | ICD-10-CM | POA: Diagnosis not present

## 2019-01-09 DIAGNOSIS — E782 Mixed hyperlipidemia: Secondary | ICD-10-CM | POA: Diagnosis not present

## 2019-01-09 DIAGNOSIS — M4726 Other spondylosis with radiculopathy, lumbar region: Secondary | ICD-10-CM | POA: Diagnosis not present

## 2019-01-09 DIAGNOSIS — M160 Bilateral primary osteoarthritis of hip: Secondary | ICD-10-CM | POA: Diagnosis not present

## 2019-01-09 DIAGNOSIS — N183 Chronic kidney disease, stage 3 (moderate): Secondary | ICD-10-CM | POA: Diagnosis not present

## 2019-01-22 ENCOUNTER — Other Ambulatory Visit: Payer: PPO

## 2019-01-22 ENCOUNTER — Other Ambulatory Visit: Payer: Self-pay

## 2019-01-22 DIAGNOSIS — E785 Hyperlipidemia, unspecified: Secondary | ICD-10-CM

## 2019-01-22 LAB — HEPATIC FUNCTION PANEL
ALT: 32 IU/L (ref 0–32)
AST: 30 IU/L (ref 0–40)
Albumin: 4.5 g/dL (ref 3.8–4.8)
Alkaline Phosphatase: 73 IU/L (ref 39–117)
Bilirubin Total: 0.5 mg/dL (ref 0.0–1.2)
Bilirubin, Direct: 0.13 mg/dL (ref 0.00–0.40)
Total Protein: 7 g/dL (ref 6.0–8.5)

## 2019-01-22 LAB — LIPID PANEL
Chol/HDL Ratio: 4 ratio (ref 0.0–4.4)
Cholesterol, Total: 145 mg/dL (ref 100–199)
HDL: 36 mg/dL — ABNORMAL LOW (ref >39)
LDL Chol Calc (NIH): 67 mg/dL (ref 0–99)
Triglycerides: 260 mg/dL — ABNORMAL HIGH (ref 0–149)
VLDL Cholesterol Cal: 42 mg/dL — ABNORMAL HIGH (ref 5–40)

## 2019-01-23 ENCOUNTER — Telehealth: Payer: Self-pay | Admitting: Cardiovascular Disease

## 2019-01-23 MED ORDER — ATORVASTATIN CALCIUM 80 MG PO TABS: 80.0000 mg | ORAL_TABLET | Freq: Every day | ORAL | 1 refills | Status: DC

## 2019-01-23 MED ORDER — OMEGA-3-ACID ETHYL ESTERS 1 G PO CAPS: 2.0000 g | ORAL_CAPSULE | Freq: Two times a day (BID) | ORAL | 1 refills | Status: DC

## 2019-01-23 NOTE — Telephone Encounter (Signed)
° ° ° °*  STAT* If patient is at the pharmacy, call can be transferred to refill team.   1. Which medications need to be refilled? (please list name of each medication and dose if known) atorvastatin (LIPITOR) 80 MG tablet,  omega-3 acid ethyl esters (LOVAZA) 1 g capsule  2. Which pharmacy/location (including street and city if local pharmacy) is medication to be sent to? [facility]  3. Do they need a 30 day or 90 day supply? [place]

## 2019-01-23 NOTE — Telephone Encounter (Signed)
Pt's medications were sent to pt's pharmacy as requested. Confirmation received.  

## 2019-01-28 NOTE — Patient Instructions (Addendum)
It was a pleasure seeing you in clinic today Tracy Rasmussen!  Keep up the good work with diet and exercise. Aim for a diet full of vegetables, fruit and lean meats (chicken, [name], fish). Try to limit carbs (bread, pasta, sugar, rice) and red meat consumption. Continue to exercise 30 mins everyday as well  Your goal TG is < 150 mg/dL, you're currently at 260 mg/dL  Medication Changes: Once you have finished taking all of your Lovaza tablets. We will contact you around the middle of December for information needed for the [place] Patient Assistance Program to cover your copay cost for Vascepa 2 g twice daily. Once approved, we will let you know when it's ready to pick up. We will also contact you for information needed to apply.   Continue Lovaza 2 g twice daily for your 2 month supply.   Please call the PharmD clinic at [phone] if you have any questions that you would like to speak with a pharmacist about [name], [place], [place]).

## 2019-01-28 NOTE — Progress Notes (Deleted)
Patient ID: Tracy Rasmussen                 DOB: July 31, 1950                    MRN: [id number]     HPI: Tracy Rasmussen is a 68 y.o. female patient referred to lipid clinic by Dr. Johnsie Cancel for hypertriglyceridemia. PMH is significant for OA of right hip, degenerative spondylolisthesis, anxiety, HLD, pulmonary nodules, small lymphocytic B-cell lymphoma involving skin, and vitamin D deficiency. She has a history of hypertriglyceridemia and has been on cholesterol medications since she was 17.Her mother also had elevated TG but no family history of CAD. We have avoided niacin due to history of LFT elevations.   Pt has previously been seen in lipid clinic by Dr. Patterson Hammersmith on 12/13/17 where her OTC fish oil was changed to rx fish oil. Dr. Patterson Hammersmith called to determine copay and Vascepa was found to be cost prohibitive at $180 per 90 days, but generic Lovaza 4g daily was cost effective at $30 per 90 days.   Pt presents today for f/u appt with lipid clinic.   Why was she taken off of simvastatin  Current Medications: atorvastatin 80 mg daily, fenofibrate 160 mg daily, omega-3 acid ethyl esters 2 grams BID  Intolerances: simvastatin (?) Risk Factors: HLD, age, family hx of elevated TG TG goal: <150 mg/dL  Diet:   Exercise:   Family History: father (kidney disease); mother (pancreatic cancer); sister (thyroid disease)  Social History:   Labs: 01/22/19 TC 145 TG 260 HDL 36 LDL; atorvastatin 80 mg daily, fenofibrate 160 mg daily, Lovaza 2 grams BID  03/07/18 TC 156 TG 328 HDL 36 LDL 54; atorvastatin 80 mg daily, fenofibrate 160 mg daily, Lovaza 2 grams BID  12/19/16 TC 133 TG 303 HDL 31 LDL (direct) 56; atorvastatin 80 mg daily, fenofibrate 160 mg daily, OTC fish oil 4 grams daily  Past Medical History:  Diagnosis Date  . Anxiety disorder   . Complication of anesthesia   . Mixed hyperlipidemia   . Osteopenia   . Over weight   . PONV (postoperative nausea and vomiting)   . Pulmonary nodules   . Small  lymphocytic B-cell lymphoma involving skin ([place])   . Spondylolisthesis of lumbar region   . Vitamin D deficiency     Current Outpatient Medications on File Prior to Visit  Medication Sig Dispense Refill  . acetaminophen (TYLENOL) 500 MG tablet Take 1,000 mg by mouth at bedtime as needed for mild pain, moderate pain, fever or headache.     . ALPRAZolam (XANAX) 0.25 MG tablet Take 0.25 mg by mouth at bedtime.     Marland Kitchen atorvastatin (LIPITOR) 80 MG tablet Take 1 tablet (80 mg total) by mouth daily. 90 tablet 1  . Calcium-Vitamin D-Vitamin K [id number] MG-UNT-MCG CHEW Chew 2 each by mouth every morning.    . cetirizine (ZYRTEC) 10 MG tablet Take 10 mg by mouth at bedtime.     . Cholecalciferol (VITAMIN D3) 5000 units CAPS Take 5,000 Units by mouth daily after breakfast.    . citalopram (CELEXA) 20 MG tablet Take 20 mg by mouth daily.    . fenofibrate 160 MG tablet TAKE ONE TABLET BY MOUTH DAILY 30 tablet 10  . Melatonin 3 MG CAPS Take 3 mg by mouth at bedtime.    . MULTIPLE VITAMIN PO Take 2 tablets by mouth daily after breakfast.     . omega-3 acid ethyl esters (LOVAZA) 1 g  capsule Take 2 capsules (2 g total) by mouth 2 (two) times daily. 360 capsule 1  . omeprazole (PRILOSEC) 20 MG capsule Take 20 mg by mouth every morning.     . traMADol (ULTRAM) 50 MG tablet Take 50-100 mg by mouth every 6 (six) hours as needed for moderate pain.      No current facility-administered medications on file prior to visit.     Allergies  Allergen Reactions  . Iodine Hives  . Ivp Dye [Iodinated Diagnostic Agents] Hives    Assessment/Plan:  1. Hypertriglyceridemia - TG goal < 150 mg/dL; therefore, pt is above goal. Plan to switch Lovaza 2 grams BID to Vascepa 2 grams BID considering Vascepa has a more potent effect on lowering TG and has ASCVD benefit. It appears Vascepa is a tier 4 agent on insurance plan, therefore will cost $90 for a 30 day supply and $180 for 90 day supply. Plan to pursue [employer].   Thank you for involving pharmacy to assist in providing Ms. Tremper's care.   Drexel Iha, PharmD PGY2 Ambulatory Care Pharmacy Resident

## 2019-01-29 ENCOUNTER — Other Ambulatory Visit: Payer: Self-pay

## 2019-01-29 ENCOUNTER — Ambulatory Visit: Payer: PPO

## 2019-01-29 ENCOUNTER — Ambulatory Visit (INDEPENDENT_AMBULATORY_CARE_PROVIDER_SITE_OTHER): Payer: PPO | Admitting: Pharmacist

## 2019-01-29 DIAGNOSIS — E782 Mixed hyperlipidemia: Secondary | ICD-10-CM

## 2019-01-29 NOTE — Progress Notes (Signed)
Patient ID: Tracy Rasmussen                 DOB: [birth year]                    MRN: [id number]     HPI: Tracy Rasmussen. PMH is significant for OA of right hip, degenerative spondylolisthesis, anxiety, HLD, pulmonary nodules, small lymphocytic B-cell lymphoma involving skin, and vitamin D deficiency. She has a history of Rasmussen and has been on cholesterol medications since she was 17.Her mother also had elevated TG but no family history of CAD.Niacin has been avoided due to history of LFT elevations, however LFTs have been stable since 2018.  Pt was seen in lipid clinic by Dr. Patterson Hammersmith on 12/13/17 where her OTC fish oil was changed to rx fish oil. Dr. Patterson Hammersmith called to determine copay and Vascepa was found to be cost prohibitive at $180 per 90 days, but generic Lovaza 4g daily was cost effective at $30 per 90 days.   Most recent visit was on 07/26/2018 with Dr. Johnsie Cancel and TG was above goal < 150 mg/dL on atorvastatin 80 mg daily, fenofibrate 160 mg daily, lovaza 2 grams BID. Patient was referred to lipid clinic for switching to [place].  Pt presents today in good spirits to the lipid clinic. TG is above goal < 150 mg/dL. Patient reports compliance with Atorvastatin 80 mg daily, Fenofibrate 160 mg daily, Lovaza 2 g BID. Patient reports no complaints of muscle cramping or GI upset. Patient's diet consist of salads, salmon, chicken, [name], burgers and [name] food occasionally; drinks water and unsweetened ice tea. Exercise is limited by back pain but tries to walk around her apartment complex for 30 mins a couple of days a week  Current Medications: atorvastatin 80 mg daily, fenofibrate 160 mg daily, Lovaza 2 g BID  Intolerances: simvastatin (unknown to pt) Risk Factors: HLD, age, family hx of elevated TG TG goal: <150 mg/dL  Diet: She tries to avoid fried foods; eats salads, salmon, chicken,  [name], burgers and [name] food occasionally; drinks water and unsweetened ice tea.   Exercise: limited by back pain but tries to walk around her apartment complex for 30 mins a couple of days a week  Family History: father (kidney disease); mother (pancreatic cancer); sister (thyroid disease)  Social History: The patient reports that she has quit smoking. She reports that she drinks alcohol but only socially. She reports that she does not use illicit drugs.   Labs: 01/22/19 TC 145 TG 260 HDL 36 LDL 67 (atorvastatin 80 mg daily, fenofibrate 160 mg daily, Lovaza 2 grams BID)  03/07/18 TC 156 TG 328 HDL 36 LDL 54 (atorvastatin 80 mg daily, fenofibrate 160 mg daily, Lovaza 2 grams BID) 12/19/16 TC 133 TG 303 HDL 31 LDL  56 (atorvastatin 80 mg daily, fenofibrate 160 mg daily, OTC fish oil 4 grams daily)  Past Medical History:  Diagnosis Date  . Anxiety disorder   . Complication of anesthesia   . Mixed hyperlipidemia   . Osteopenia   . Over weight   . PONV (postoperative nausea and vomiting)   . Pulmonary nodules   . Small lymphocytic B-cell lymphoma involving skin ([place])   . Spondylolisthesis of lumbar region   . Vitamin D deficiency     Current Outpatient Medications on File Prior to Visit  Medication Sig Dispense Refill  . acetaminophen (TYLENOL) 500 MG tablet  Take 1,000 mg by mouth at bedtime as needed for mild pain, moderate pain, fever or headache.     . ALPRAZolam (XANAX) 0.25 MG tablet Take 0.25 mg by mouth at bedtime.     Marland Kitchen atorvastatin (LIPITOR) 80 MG tablet Take 1 tablet (80 mg total) by mouth daily. 90 tablet 1  . Calcium-Vitamin D-Vitamin K [id number] MG-UNT-MCG CHEW Chew 2 each by mouth every morning.    . cetirizine (ZYRTEC) 10 MG tablet Take 10 mg by mouth at bedtime.     . Cholecalciferol (VITAMIN D3) 5000 units CAPS Take 5,000 Units by mouth daily after breakfast.    . citalopram (CELEXA) 20 MG tablet Take 20 mg by mouth daily.    . fenofibrate 160 MG tablet TAKE  ONE TABLET BY MOUTH DAILY 30 tablet 10  . Melatonin 3 MG CAPS Take 3 mg by mouth at bedtime.    . MULTIPLE VITAMIN PO Take 2 tablets by mouth daily after breakfast.     . omega-3 acid ethyl esters (LOVAZA) 1 g capsule Take 2 capsules (2 g total) by mouth 2 (two) times daily. 360 capsule 1  . omeprazole (PRILOSEC) 20 MG capsule Take 20 mg by mouth every morning.     . traMADol (ULTRAM) 50 MG tablet Take 50-100 mg by mouth every 6 (six) hours as needed for moderate pain.      No current facility-administered medications on file prior to visit.     Allergies  Allergen Reactions  . Iodine Hives  . Ivp Dye [Iodinated Diagnostic Agents] Hives    Assessment/Plan:  1. Rasmussen - TG is above goal < 150 mg/dL. Switch Lovaza 2 grams BID to Vascepa 2 grams BID for TG management and the additional ASCVD benefit. Patient recently purchased a 2 month supply of Lovaza, so patient was instructed to finish this supply and then pharmacy will contact patient in the middle of December for information needed for the [employer] Patient Assistance Program to cover the copay cost of Vascepa. Once approved, patient requested prescription to be sent to [facility]. Encouraged patient to continue eating a low-carb/salt diet, and limit sugar and alcohol intake. Also encouraged patient to continue to exercise for at least 30 mins 3-4 times a week.  Recommend rechecking fasting lipid panel 3 months after the initiation of Vascepa.   Lorel Monaco, PharmD PGY1 [place] [id number] [place], [place] [address] Phone: [phone]; Fax: [phone]

## 2019-02-04 DIAGNOSIS — E611 Iron deficiency: Secondary | ICD-10-CM | POA: Diagnosis not present

## 2019-02-04 DIAGNOSIS — C8580 Other specified types of non-Hodgkin lymphoma, unspecified site: Secondary | ICD-10-CM | POA: Diagnosis not present

## 2019-03-11 DIAGNOSIS — N183 Chronic kidney disease, stage 3 unspecified: Secondary | ICD-10-CM | POA: Diagnosis not present

## 2019-03-11 DIAGNOSIS — E782 Mixed hyperlipidemia: Secondary | ICD-10-CM | POA: Diagnosis not present

## 2019-03-11 DIAGNOSIS — M160 Bilateral primary osteoarthritis of hip: Secondary | ICD-10-CM | POA: Diagnosis not present

## 2019-03-11 DIAGNOSIS — M4726 Other spondylosis with radiculopathy, lumbar region: Secondary | ICD-10-CM | POA: Diagnosis not present

## 2019-03-12 ENCOUNTER — Telehealth: Payer: Self-pay | Admitting: Pharmacist

## 2019-03-12 MED ORDER — VASCEPA 1 G PO CAPS: 2.0000 g | ORAL_CAPSULE | Freq: Two times a day (BID) | ORAL | 3 refills | Status: DC

## 2019-03-12 NOTE — Telephone Encounter (Signed)
Called pt to coordinate change from Lovaza to Vascepa. Vascepa is on formulary but is cost prohibitive at Tier 4 ($100/month). Applied for [employer] and pt was approved for $2500 in copay assistance. Rx sent to [facility] and provided them with grant info - confirmed $0 copay. Pt will call with any issues.

## 2019-04-08 DIAGNOSIS — L905 Scar conditions and fibrosis of skin: Secondary | ICD-10-CM | POA: Diagnosis not present

## 2019-04-08 DIAGNOSIS — J0101 Acute recurrent maxillary sinusitis: Secondary | ICD-10-CM | POA: Diagnosis not present

## 2019-04-10 DIAGNOSIS — M160 Bilateral primary osteoarthritis of hip: Secondary | ICD-10-CM | POA: Diagnosis not present

## 2019-04-10 DIAGNOSIS — M4726 Other spondylosis with radiculopathy, lumbar region: Secondary | ICD-10-CM | POA: Diagnosis not present

## 2019-04-10 DIAGNOSIS — E782 Mixed hyperlipidemia: Secondary | ICD-10-CM | POA: Diagnosis not present

## 2019-04-15 DIAGNOSIS — W19XXXA Unspecified fall, initial encounter: Secondary | ICD-10-CM | POA: Diagnosis not present

## 2019-04-15 DIAGNOSIS — Z96641 Presence of right artificial hip joint: Secondary | ICD-10-CM | POA: Diagnosis not present

## 2019-04-15 DIAGNOSIS — Z9181 History of falling: Secondary | ICD-10-CM | POA: Diagnosis not present

## 2019-04-25 DIAGNOSIS — E611 Iron deficiency: Secondary | ICD-10-CM | POA: Diagnosis not present

## 2019-04-25 DIAGNOSIS — C8510 Unspecified B-cell lymphoma, unspecified site: Secondary | ICD-10-CM | POA: Diagnosis not present

## 2019-04-25 DIAGNOSIS — D509 Iron deficiency anemia, unspecified: Secondary | ICD-10-CM | POA: Diagnosis not present

## 2019-04-25 DIAGNOSIS — C8519 Unspecified B-cell lymphoma, extranodal and solid organ sites: Secondary | ICD-10-CM | POA: Diagnosis not present

## 2019-04-25 DIAGNOSIS — R718 Other abnormality of red blood cells: Secondary | ICD-10-CM | POA: Diagnosis not present

## 2019-05-08 DIAGNOSIS — E782 Mixed hyperlipidemia: Secondary | ICD-10-CM | POA: Diagnosis not present

## 2019-05-08 DIAGNOSIS — M4726 Other spondylosis with radiculopathy, lumbar region: Secondary | ICD-10-CM | POA: Diagnosis not present

## 2019-05-08 DIAGNOSIS — M160 Bilateral primary osteoarthritis of hip: Secondary | ICD-10-CM | POA: Diagnosis not present

## 2019-05-10 DIAGNOSIS — J0101 Acute recurrent maxillary sinusitis: Secondary | ICD-10-CM | POA: Diagnosis not present

## 2019-05-11 DIAGNOSIS — J0101 Acute recurrent maxillary sinusitis: Secondary | ICD-10-CM | POA: Diagnosis not present

## 2019-05-20 ENCOUNTER — Other Ambulatory Visit: Payer: Self-pay | Admitting: Otolaryngology

## 2019-05-20 DIAGNOSIS — J0101 Acute recurrent maxillary sinusitis: Secondary | ICD-10-CM

## 2019-05-28 ENCOUNTER — Ambulatory Visit
Admission: RE | Admit: 2019-05-28 | Discharge: 2019-05-28 | Disposition: A | Discharge status: Home / Self Care | Payer: PPO | Source: Ambulatory Visit | Attending: Otolaryngology | Admitting: Otolaryngology

## 2019-05-28 DIAGNOSIS — J3489 Other specified disorders of nose and nasal sinuses: Secondary | ICD-10-CM | POA: Diagnosis not present

## 2019-05-28 DIAGNOSIS — J0101 Acute recurrent maxillary sinusitis: Secondary | ICD-10-CM

## 2019-06-05 DIAGNOSIS — M160 Bilateral primary osteoarthritis of hip: Secondary | ICD-10-CM | POA: Diagnosis not present

## 2019-06-05 DIAGNOSIS — M4726 Other spondylosis with radiculopathy, lumbar region: Secondary | ICD-10-CM | POA: Diagnosis not present

## 2019-06-05 DIAGNOSIS — E782 Mixed hyperlipidemia: Secondary | ICD-10-CM | POA: Diagnosis not present

## 2019-06-11 DIAGNOSIS — D1801 Hemangioma of skin and subcutaneous tissue: Secondary | ICD-10-CM | POA: Diagnosis not present

## 2019-06-11 DIAGNOSIS — Z1283 Encounter for screening for malignant neoplasm of skin: Secondary | ICD-10-CM | POA: Diagnosis not present

## 2019-06-11 DIAGNOSIS — L821 Other seborrheic keratosis: Secondary | ICD-10-CM | POA: Diagnosis not present

## 2019-06-11 DIAGNOSIS — L814 Other melanin hyperpigmentation: Secondary | ICD-10-CM | POA: Diagnosis not present

## 2019-06-11 DIAGNOSIS — T148XXA Other injury of unspecified body region, initial encounter: Secondary | ICD-10-CM | POA: Diagnosis not present

## 2019-06-11 DIAGNOSIS — C8519 Unspecified B-cell lymphoma, extranodal and solid organ sites: Secondary | ICD-10-CM | POA: Diagnosis not present

## 2019-06-11 DIAGNOSIS — L738 Other specified follicular disorders: Secondary | ICD-10-CM | POA: Diagnosis not present

## 2019-06-11 DIAGNOSIS — C8510 Unspecified B-cell lymphoma, unspecified site: Secondary | ICD-10-CM | POA: Diagnosis not present

## 2019-06-13 ENCOUNTER — Ambulatory Visit: Payer: PPO | Attending: Internal Medicine

## 2019-06-13 DIAGNOSIS — Z23 Encounter for immunization: Secondary | ICD-10-CM

## 2019-06-13 NOTE — Progress Notes (Signed)
   Covid-19 Vaccination Clinic  Name:  Tracy Rasmussen    MRN: [id number] DOB: 1950/10/30  06/13/2019  [name] was observed post Covid-19 immunization for 15 minutes without incident. She was provided with Vaccine Information Sheet and instruction to access the V-Safe system.   [name] was instructed to call 911 with any severe reactions post vaccine: Marland Kitchen Difficulty breathing  . Swelling of face and throat  . A fast heartbeat  . A bad rash all over body  . Dizziness and weakness   Immunizations Administered    Name Date Dose VIS Date Route   Pfizer COVID-19 Vaccine 06/13/2019 10:36 AM 0.3 mL 03/22/2019 Intramuscular   Manufacturer: [place]   Lot: [id number]   [place]: [id number]

## 2019-07-01 ENCOUNTER — Other Ambulatory Visit: Payer: Self-pay

## 2019-07-01 MED ORDER — ATORVASTATIN CALCIUM 80 MG PO TABS: 80.0000 mg | ORAL_TABLET | Freq: Every day | ORAL | 0 refills | Status: DC

## 2019-07-01 MED ORDER — FENOFIBRATE 160 MG PO TABS: 160.0000 mg | ORAL_TABLET | Freq: Every day | ORAL | 0 refills | Status: DC

## 2019-07-08 DIAGNOSIS — M4726 Other spondylosis with radiculopathy, lumbar region: Secondary | ICD-10-CM | POA: Diagnosis not present

## 2019-07-08 DIAGNOSIS — M160 Bilateral primary osteoarthritis of hip: Secondary | ICD-10-CM | POA: Diagnosis not present

## 2019-07-08 DIAGNOSIS — G4700 Insomnia, unspecified: Secondary | ICD-10-CM | POA: Diagnosis not present

## 2019-07-08 DIAGNOSIS — N183 Chronic kidney disease, stage 3 unspecified: Secondary | ICD-10-CM | POA: Diagnosis not present

## 2019-07-08 DIAGNOSIS — E782 Mixed hyperlipidemia: Secondary | ICD-10-CM | POA: Diagnosis not present

## 2019-07-09 ENCOUNTER — Ambulatory Visit: Payer: PPO | Attending: Internal Medicine

## 2019-07-09 DIAGNOSIS — Z23 Encounter for immunization: Secondary | ICD-10-CM

## 2019-07-09 NOTE — Progress Notes (Signed)
   Covid-19 Vaccination Clinic  Name:  Tracy Rasmussen    MRN: [id number] DOB: [birth year]  07/09/2019  [name] was observed post Covid-19 immunization for 15 minutes without incident. She was provided with Vaccine Information Sheet and instruction to access the V-Safe system.   [name] was instructed to call 911 with any severe reactions post vaccine: Marland Kitchen Difficulty breathing  . Swelling of face and throat  . A fast heartbeat  . A bad rash all over body  . Dizziness and weakness   Immunizations Administered    Name Date Dose VIS Date Route   Pfizer COVID-19 Vaccine 07/09/2019  3:21 PM 0.3 mL 03/22/2019 Intramuscular   Manufacturer: [place]   Lot: [id number]   [place]: [id number]

## 2019-07-10 IMAGING — MR MR LUMBAR SPINE WO/W CM
4 of 8 series · 25 of 48 positions shown · IV contrast (15cc Multihance)
Comparison: Postmyelogram CT scan 07/14/2017.

CLINICAL DATA: Low back pain radiating into the right buttock and
leg. Symptoms are chronic. Right lower extremity weakness. History
of prior lumbar surgery x2, most recently 09/05/2017.

EXAM:
MRI LUMBAR SPINE WITHOUT AND WITH CONTRAST
TECHNIQUE: Multiplanar and multiecho pulse sequences of the lumbar spine were
obtained without and with intravenous contrast.
CONTRAST:  15 mL MULTIHANCE GADOBENATE DIMEGLUMINE 529 MG/ML IV SOLN

[Series 3: T1 · sagittal · 4.0mm · 0.51mm/px · 5 of 17 slices shown (1 of 2)]
[im 1/17]
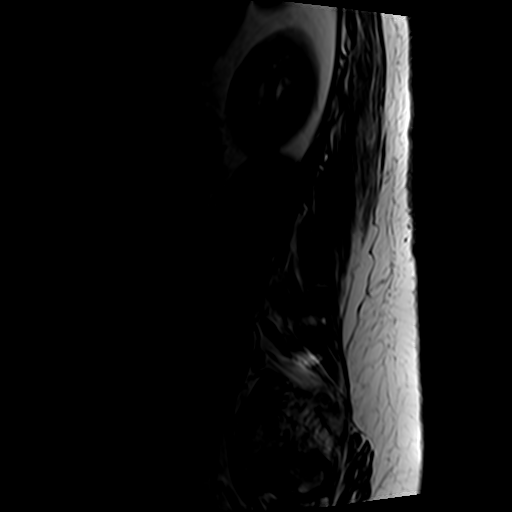
[im 5/17]
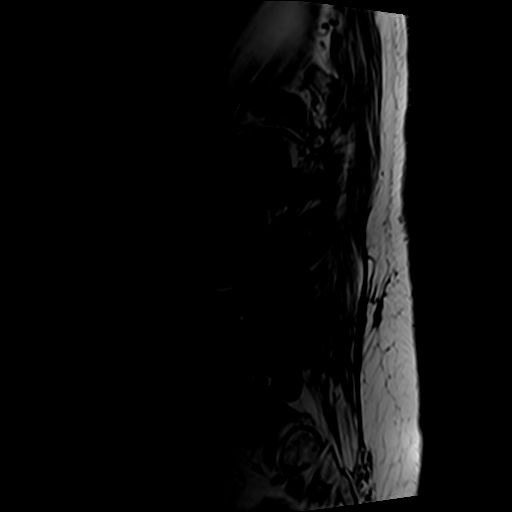
[im 9/17]
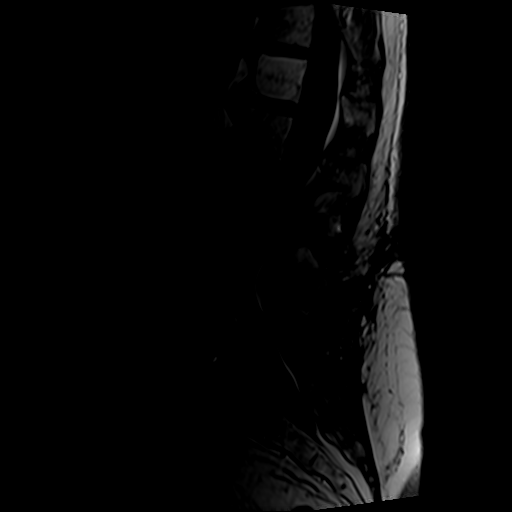
[im 13/17]
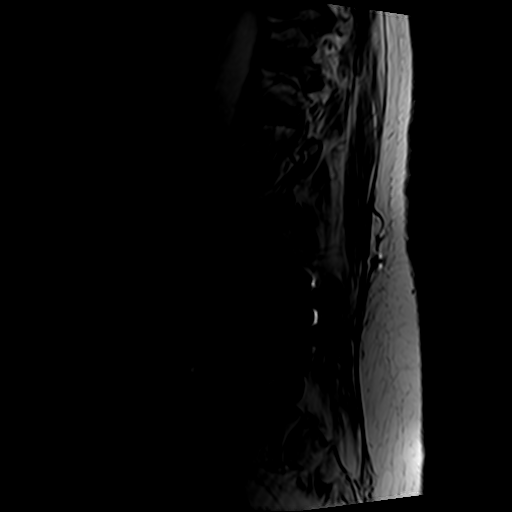
[im 17/17]
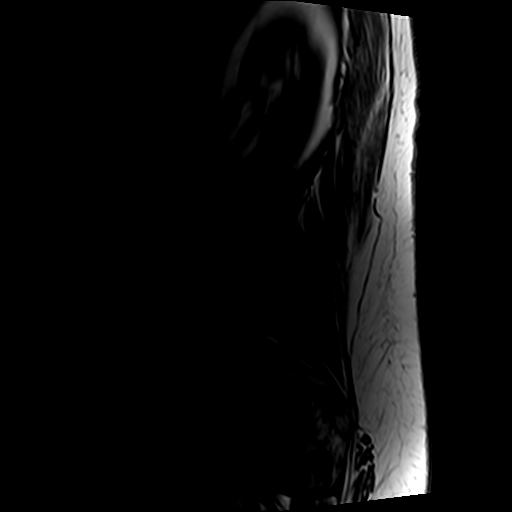

[Series 5: T2 · axial · 4.0mm · 0.70mm/px · z∈[-17,+171]mm · 9 of 35 slices shown (1 of 2)]
[im 1/35]
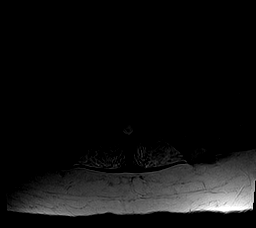
[im 5/35]
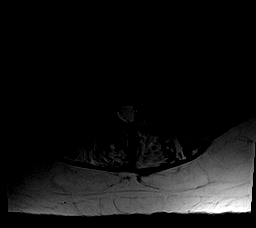
[im 9/35]
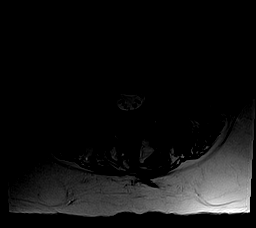
[im 13/35]
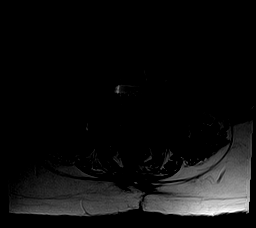
[im 18/35]
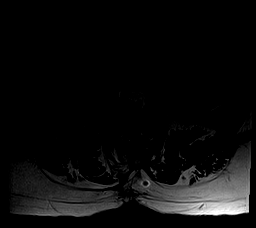
[im 22/35]
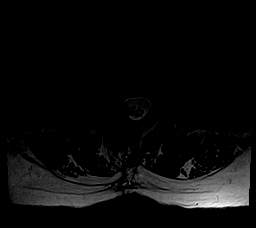
[im 26/35]
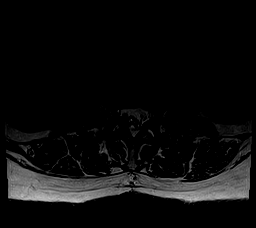
[im 30/35]
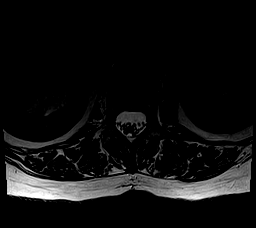
[im 35/35]
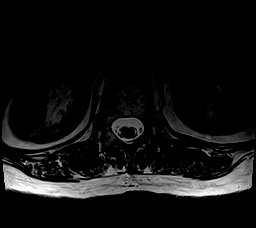

[Series 6: T1 · axial · 4.0mm · 0.35mm/px · z∈[-17,+145]mm · 7 of 35 slices shown (2 of 2)]
[im 1/35]
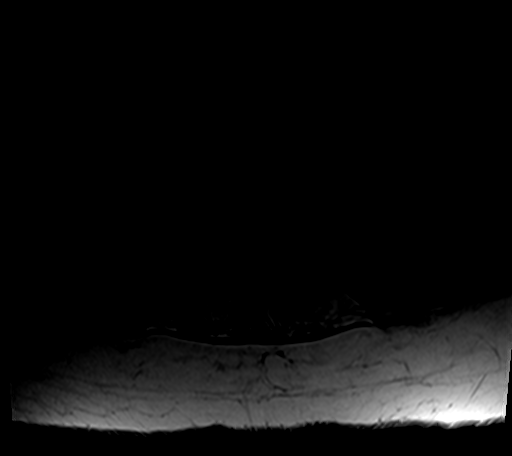
[im 5/35]
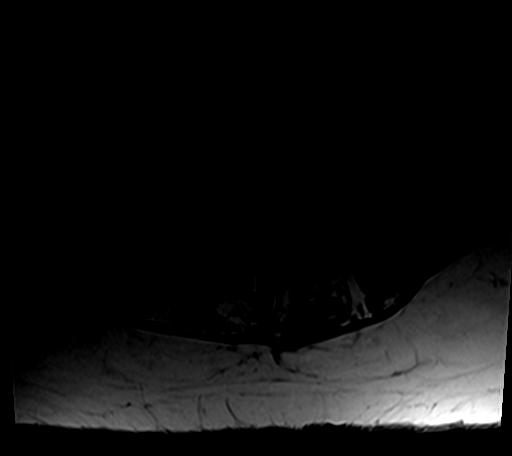
[im 9/35]
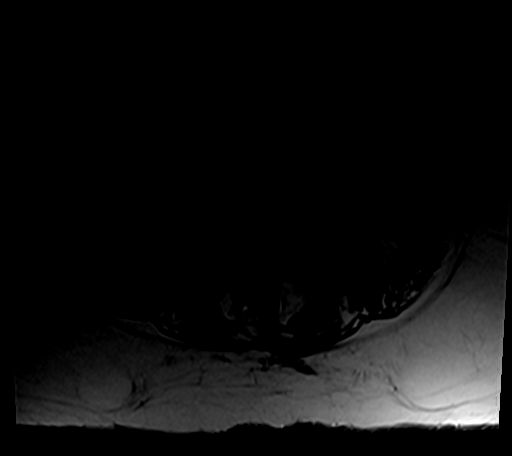
[im 13/35]
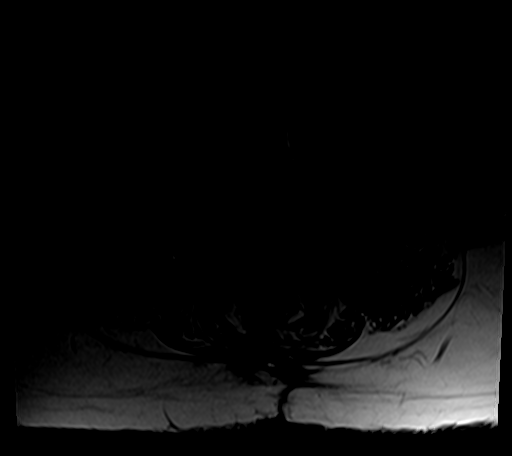
[im 18/35]
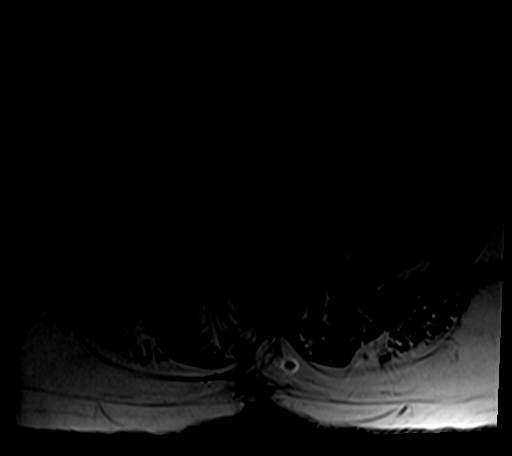
[im 22/35]
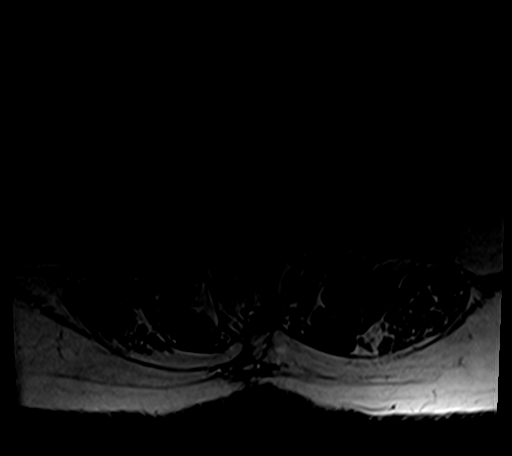
[im 30/35]
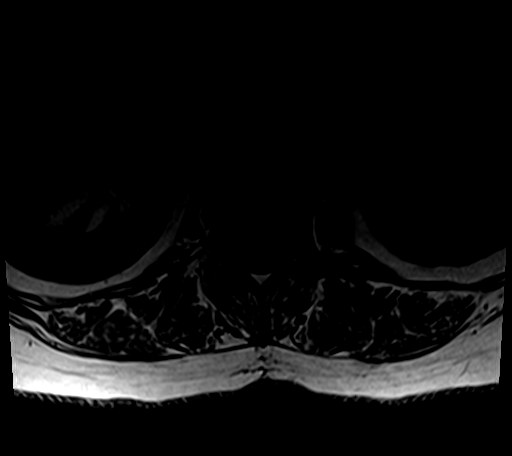

[Series 7: T2 · sagittal · 4.0mm · 0.51mm/px · 4 of 17 slices shown (2 of 2)]
[im 1/17]
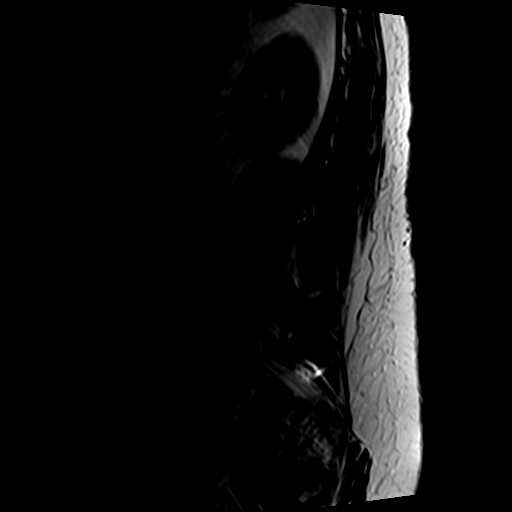
[im 6/17]
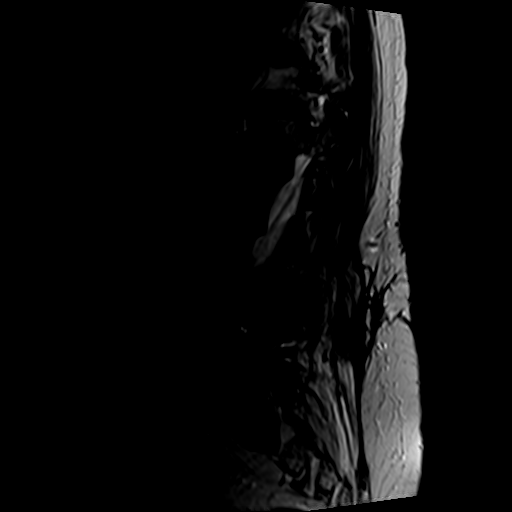
[im 11/17]
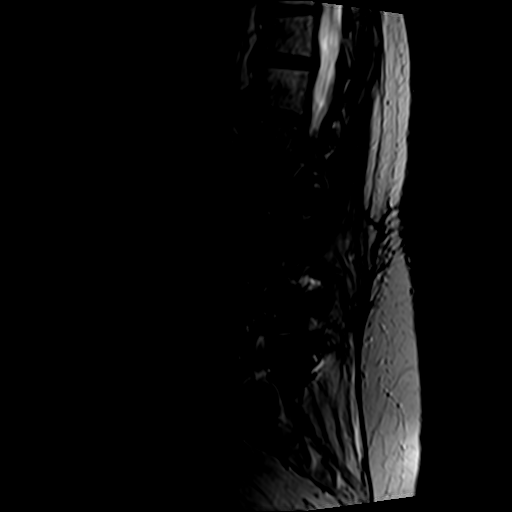
[im 17/17]
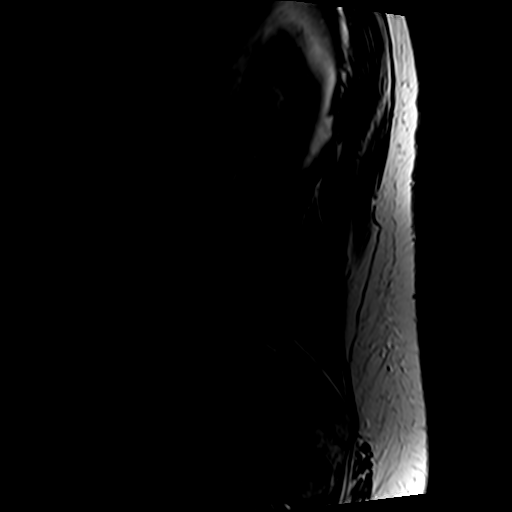

[25 of 48 positions shown; findings below may reference images not displayed]

FINDINGS: Segmentation:  Standard.

Alignment: Trace anterolisthesis L4 on L5 has been reduced from
cm on the prior examination. 0.3 cm retrolisthesis L1 on L2 and L2
on L3 is unchanged. There is convex left scoliosis.

Vertebrae: No fracture or worrisome lesion. Hemangioma is seen at
T12. There is some degenerative endplate signal change at L2-3. The
patient has undergone L4-5 fusion since the prior MRI.

Conus medullaris and cauda equina: Conus extends to the L1 level.
Conus and cauda equina appear normal.

Paraspinal and other soft tissues: Negative.

Disc levels:

T10-11 and T11-12 are imaged in the sagittal plane only. Small disc
bulges or protrusions are seen at both levels without evidence of
stenosis.

T12-L1: Shallow broad-based disc protrusion.  No stenosis.

L1-2: Shallow disc bulge without stenosis.

L2-3: Discontinuity of the right inferior facet of L2 is again seen.
The patient has a disc bulge eccentric to the right with associated
endplate spurring. There is marked right foraminal narrowing and
some narrowing in the right lateral recess. The central canal and
left foramen are open. The appearance is unchanged.

L3-4: Partially calcified left paracentral disc protrusion is again
seen. The disc causes mild narrowing in the left lateral recess. The
foramina are open. The appearance is unchanged.

L4-5: Status post discectomy and fusion since the prior examination.
The central canal and foramina appear open.

L5-S1: Loss of disc space height and endplate spur are again seen.
Left laminotomy defect is also again identified. The central canal
is open. Disc and endplate spur cause bilateral foraminal narrowing,
worse on the left. The appearance is unchanged.
IMPRESSION: No new abnormality since the prior examination.

Status post L4-5 fusion since the prior study. The central canal and
foramina are open at L4-5.

No change in mild narrowing in the right subarticular recess and
marked right foraminal narrowing at L2-3 due to disc and endplate
spur. Discontinuity of the inferior right L2 facet is noted as seen
on the prior exam.

No change in mild narrowing in the left lateral recess at L3-4 due
to a partially calcified left paracentral disc protrusion. No nerve
root compression is seen at this level.

## 2019-07-25 DIAGNOSIS — N183 Chronic kidney disease, stage 3 unspecified: Secondary | ICD-10-CM | POA: Diagnosis not present

## 2019-07-25 DIAGNOSIS — Z0000 Encounter for general adult medical examination without abnormal findings: Secondary | ICD-10-CM | POA: Diagnosis not present

## 2019-07-25 DIAGNOSIS — E782 Mixed hyperlipidemia: Secondary | ICD-10-CM | POA: Diagnosis not present

## 2019-07-25 DIAGNOSIS — G4700 Insomnia, unspecified: Secondary | ICD-10-CM | POA: Diagnosis not present

## 2019-07-25 DIAGNOSIS — F419 Anxiety disorder, unspecified: Secondary | ICD-10-CM | POA: Diagnosis not present

## 2019-07-25 DIAGNOSIS — R739 Hyperglycemia, unspecified: Secondary | ICD-10-CM | POA: Diagnosis not present

## 2019-08-05 DIAGNOSIS — E559 Vitamin D deficiency, unspecified: Secondary | ICD-10-CM | POA: Diagnosis not present

## 2019-08-05 DIAGNOSIS — R7303 Prediabetes: Secondary | ICD-10-CM | POA: Diagnosis not present

## 2019-08-08 DIAGNOSIS — N183 Chronic kidney disease, stage 3 unspecified: Secondary | ICD-10-CM | POA: Diagnosis not present

## 2019-08-08 DIAGNOSIS — E782 Mixed hyperlipidemia: Secondary | ICD-10-CM | POA: Diagnosis not present

## 2019-08-08 DIAGNOSIS — M4726 Other spondylosis with radiculopathy, lumbar region: Secondary | ICD-10-CM | POA: Diagnosis not present

## 2019-08-08 DIAGNOSIS — G4700 Insomnia, unspecified: Secondary | ICD-10-CM | POA: Diagnosis not present

## 2019-08-08 DIAGNOSIS — M160 Bilateral primary osteoarthritis of hip: Secondary | ICD-10-CM | POA: Diagnosis not present

## 2019-08-14 DIAGNOSIS — Z23 Encounter for immunization: Secondary | ICD-10-CM | POA: Diagnosis not present

## 2019-08-30 DIAGNOSIS — N183 Chronic kidney disease, stage 3 unspecified: Secondary | ICD-10-CM | POA: Diagnosis not present

## 2019-08-30 DIAGNOSIS — E782 Mixed hyperlipidemia: Secondary | ICD-10-CM | POA: Diagnosis not present

## 2019-08-30 DIAGNOSIS — G4700 Insomnia, unspecified: Secondary | ICD-10-CM | POA: Diagnosis not present

## 2019-08-30 DIAGNOSIS — M4726 Other spondylosis with radiculopathy, lumbar region: Secondary | ICD-10-CM | POA: Diagnosis not present

## 2019-08-30 DIAGNOSIS — M160 Bilateral primary osteoarthritis of hip: Secondary | ICD-10-CM | POA: Diagnosis not present

## 2019-09-06 NOTE — Progress Notes (Signed)
Evaluation Performed:  Follow-up visit  Date:  09/06/2019   ID:  Wyvonne Holder, DOB 1951/02/05, MRN [id number]   PCP:  Kathyrn Lass, MD  Cardiologist:  Johnsie Cancel Electrophysiologist:  None   Chief Complaint:  HLD  History of Present Illness:    Tracy Rasmussen is a 69 y.o. female  has had Rx for lipids since age 65 She has had issues with depression estranged from sister in [place]. Activity limited by arthritis. Had right THR done January 2020   Furlowed from [place] alone Walking around neighborhood  Received COVID Vaccine end of March   Primary issue has been elevated triglycerides. Had elevated LFT;s in past and Niacin avoided Rx with lipitor 80 mg, fenofibrate 160 mg and Lovaza. Recently got grant/copay help and changed to [place]. 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 Anxiety disorder   . Complication of anesthesia   . Mixed hyperlipidemia   . Osteopenia   . Over weight   . PONV (postoperative nausea and vomiting)   . Pulmonary nodules   . Small lymphocytic B-cell lymphoma involving skin ([place])   . Spondylolisthesis of lumbar region   . Vitamin D deficiency    Past Surgical History:  Procedure Laterality Date  . BACK SURGERY  1994   L5, S1 herniated disc  . BACK SURGERY     Herniated Disc 2018  . BACK SURGERY     Fusion L4 & L5, Herniated Disc 2019  . BREAST BIOPSY  2006   benign  . CRYOTHERAPY  1980's  . TOTAL HIP ARTHROPLASTY Right 05/08/2018   Procedure: TOTAL HIP ARTHROPLASTY ANTERIOR APPROACH;  Surgeon: Melrose Nakayama, MD;  Location: [facility];  Service: Orthopedics;  Laterality: Right;  . WISDOM TOOTH EXTRACTION     teenage years     No outpatient medications have been marked as taking for the 09/11/19 encounter (Appointment) with Josue Hector,  MD.     Allergies:   Iodine and Ivp dye [iodinated diagnostic agents]   Social History   Tobacco Use  . Smoking status: Former Research scientist (life sciences)  . Smokeless tobacco: Former Systems developer  . Tobacco comment: quit 2005  Substance Use Topics  . Alcohol use: Yes    Alcohol/week: 0.0 standard drinks    Comment: social  . Drug use: No     Family Hx: The patient's family history includes Kidney disease in her father; Pancreatic cancer in her mother; Thyroid disease in her sister. There is no history of Colon cancer, Colon polyps, or Liver disease.  ROS:   Please see the history of present illness.     All other systems reviewed and are negative.   Prior CV studies:   The following studies were reviewed today:  None  Labs/Other Tests and Data Reviewed:    EKG: 06/14/17 NSR rate 67 normal ECG   Recent Labs: 01/22/2019: ALT 32   Recent Lipid Panel Lab Results  Component Value Date/Time   CHOL 145 01/22/2019 11:38 AM   TRIG 260 (H) 01/22/2019 11:38 AM   HDL 36 (L) 01/22/2019 11:38 AM   CHOLHDL 4.0 01/22/2019 11:38 AM   CHOLHDL 5.5 (H) 02/15/2016 07:33 AM   LDLCALC 67 01/22/2019 11:38 AM   LDLDIRECT 56 12/19/2016 07:51 AM   LDLDIRECT 86  08/25/2015 07:46 AM    Wt Readings from Last 3 Encounters:  07/26/18 163 lb (73.9 kg)  05/08/18 159 lb (72.1 kg)  04/30/18 159 lb 4.8 oz (72.3 kg)     Objective:    Vital Signs:  There were no vitals taken for this visit.   Affect appropriate Healthy:  appears stated age 83: normal Neck supple with no adenopathy JVP normal no bruits no thyromegaly Lungs clear with no wheezing and good diaphragmatic motion Heart:  S1/S2 no murmur, no rub, gallop or click PMI normal Abdomen: benighn, BS positve, no tenderness, no AAA no bruit.  No HSM or HJR Distal pulses intact with no bruits No edema Neuro non-focal Skin warm and dry No muscular weakness  ECG:  NSR rate 60 normal ECG  09/11/19   ASSESSMENT & PLAN:    1. Lipids:  Primary issue will see  if she can start seeing Dr Debara Pickett who is board certified in lipidology. Will f/u       labs now that she is on Vascepa 2. Ortho:  Post THR on right PT/OT Tramadol for pain     Medication Adjustments/Labs and Tests Ordered: Current medicines are reviewed at length with the patient today.  Concerns regarding medicines are outlined above.   Tests Ordered:  Lipids LFTls   Medication Changes: No orders of the defined types were placed in this encounter.   Disposition:  Follow up with Dr Debara Pickett and lipid clinic 6 months   Signed, Jenkins Rouge, MD  09/06/2019 4:34 PM    [place]

## 2019-09-11 ENCOUNTER — Ambulatory Visit: Payer: PPO | Admitting: Cardiovascular Disease

## 2019-09-11 ENCOUNTER — Other Ambulatory Visit: Payer: Self-pay

## 2019-09-11 ENCOUNTER — Encounter: Payer: Self-pay | Admitting: Cardiovascular Disease

## 2019-09-11 VITALS — BP 132/82 | HR 60 | Ht 63.0 in | Wt 174.0 lb

## 2019-09-11 DIAGNOSIS — E785 Hyperlipidemia, unspecified: Secondary | ICD-10-CM

## 2019-09-11 NOTE — Patient Instructions (Addendum)
Medication Instructions:  *If you need a refill on your cardiac medications before your next appointment, please call your pharmacy*  Lab Work: Your physician recommends that you return for lab work next available for fasting lipid and CMET  If you have labs (blood work) drawn today and your tests are completely normal, you will receive your results only by: Marland Kitchen MyChart Message (if you have MyChart) OR . A paper copy in the mail If you have any lab test that is abnormal or we need to change your treatment, we will call you to review the results.  Testing/Procedures: None ordered today.  Follow-Up: At [facility], you and your health needs are our priority.  As part of our continuing mission to provide you with exceptional heart care, we have created designated Provider Care Teams.  These Care Teams include your primary Cardiologist (physician) and Advanced Practice Providers (APPs -  Physician Assistants and Nurse Practitioners) who all work together to provide you with the care you need, when you need it.  We recommend signing up for the patient portal called "MyChart".  Sign up information is provided on this After Visit Summary.  MyChart is used to connect with patients for Virtual Visits (Telemedicine).  Patients are able to view lab/test results, encounter notes, upcoming appointments, etc.  Non-urgent messages can be sent to your provider as well.   To learn more about what you can do with MyChart, go to [email or web address].    Your next appointment:   6 month(s)  The format for your next appointment:   In Person  Provider:   You may see Dr. Johnsie Cancel or one of the following Advanced Practice Providers on your designated Care Team:    Truitt Merle, NP  Cecilie Kicks, NP  Kathyrn Drown, NP

## 2019-09-12 IMAGING — RF DG C-ARM 61-120 MIN
1 series · 4 of 4 positions shown · non-contrast
Comparison: None.

CLINICAL DATA: 67-year-old female post right hip replacement.
Initial encounter.

EXAM:
OPERATIVE right HIP (WITH PELVIS IF PERFORMED) 4 VIEWS
TECHNIQUE: Fluoroscopic spot image(s) were submitted for interpretation
post-operatively.
Fluoroscopic time: 20 seconds.

[Series 1: unknown protocol · 0.20mm/px · 4 of 4 slices shown]
[im 1/4]
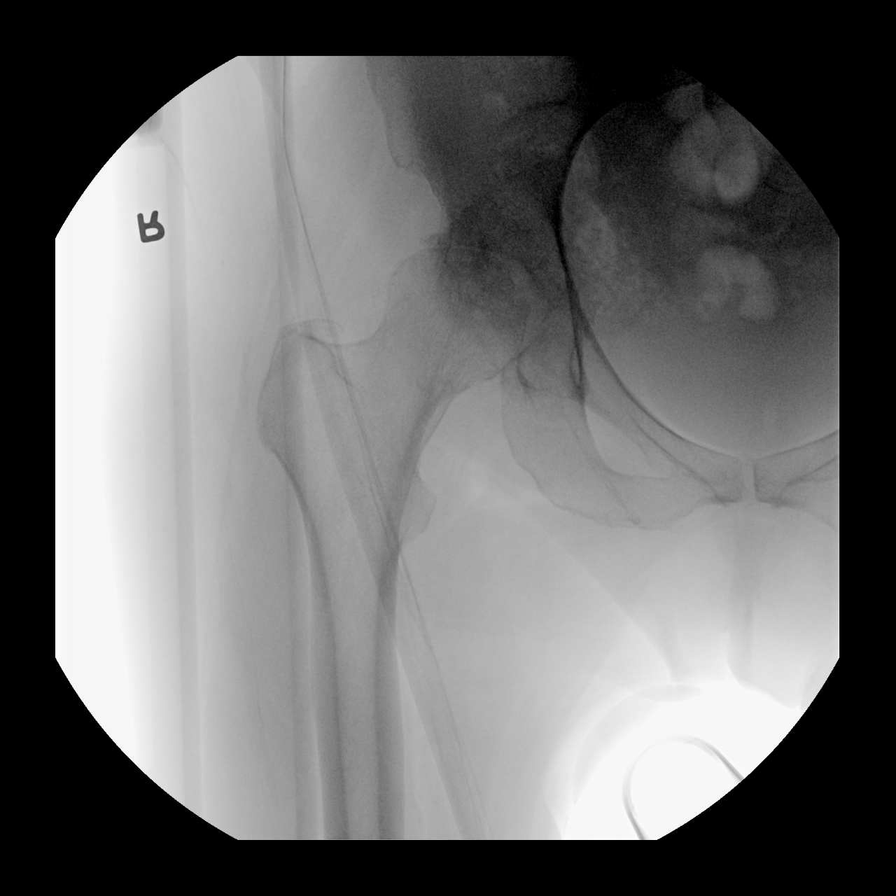
[im 2/4]
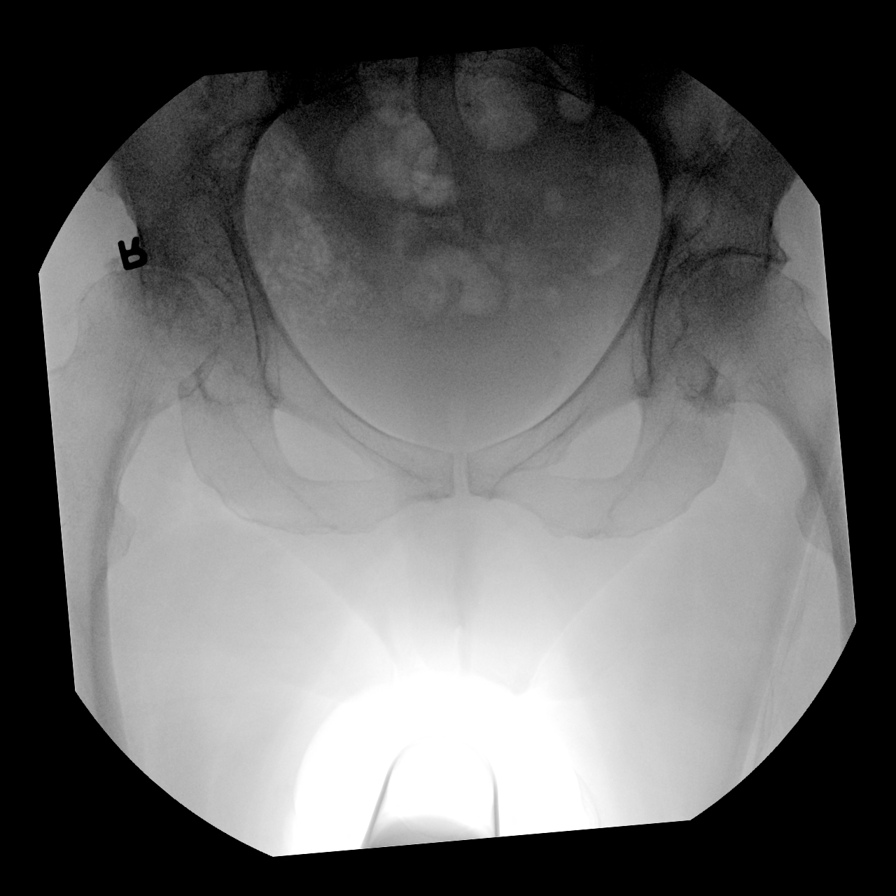
[im 3/4]
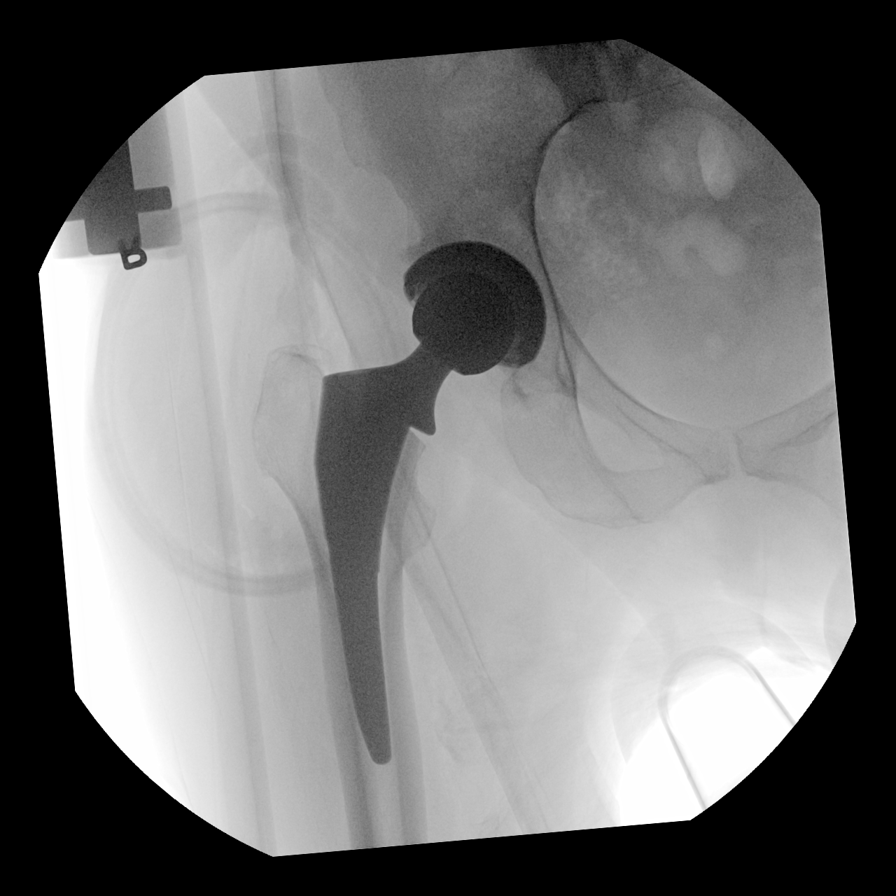
[im 4/4]
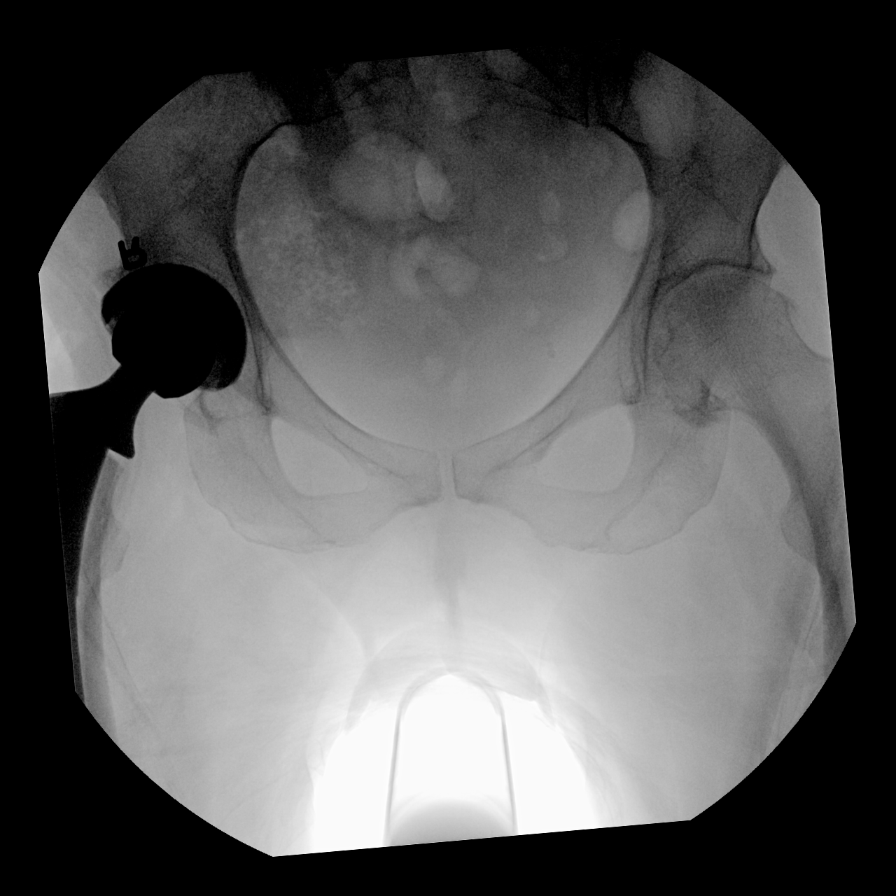

[4 of 4 positions shown; findings below may reference images not displayed]

FINDINGS: Post total right hip replacement which appears in satisfactory
position without complication noted on frontal imaging.

Left hip joint degenerative changes.
IMPRESSION: Post right hip replacement.

## 2019-09-18 ENCOUNTER — Other Ambulatory Visit: Payer: PPO

## 2019-09-18 ENCOUNTER — Other Ambulatory Visit: Payer: Self-pay

## 2019-09-18 DIAGNOSIS — E785 Hyperlipidemia, unspecified: Secondary | ICD-10-CM

## 2019-09-18 LAB — LIPID PANEL
Chol/HDL Ratio: 5.9 ratio — ABNORMAL HIGH (ref 0.0–4.4)
Cholesterol, Total: 165 mg/dL (ref 100–199)
HDL: 28 mg/dL — ABNORMAL LOW (ref >39)
LDL Chol Calc (NIH): 73 mg/dL (ref 0–99)
Triglycerides: 404 mg/dL — ABNORMAL HIGH (ref 0–149)
VLDL Cholesterol Cal: 64 mg/dL — ABNORMAL HIGH (ref 5–40)

## 2019-09-18 LAB — COMPREHENSIVE METABOLIC PANEL
ALT: 28 IU/L (ref 0–32)
AST: 26 IU/L (ref 0–40)
Albumin/Globulin Ratio: 1.8 (ref 1.2–2.2)
Albumin: 4.4 g/dL (ref 3.8–4.8)
Alkaline Phosphatase: 83 IU/L (ref 48–121)
BUN/Creatinine Ratio: 26 (ref 12–28)
BUN: 23 mg/dL (ref 8–27)
Bilirubin Total: 0.4 mg/dL (ref 0.0–1.2)
CO2: 22 mmol/L (ref 20–29)
Calcium: 9.6 mg/dL (ref 8.7–10.3)
Chloride: 104 mmol/L (ref 96–106)
Creatinine, Ser: 0.9 mg/dL (ref 0.57–1.00)
GFR calc Af Amer: 76 mL/min/{1.73_m2} (ref >59)
GFR calc non Af Amer: 66 mL/min/{1.73_m2} (ref >59)
Globulin, Total: 2.4 g/dL (ref 1.5–4.5)
Glucose: 123 mg/dL — ABNORMAL HIGH (ref 65–99)
Potassium: 4.9 mmol/L (ref 3.5–5.2)
Sodium: 140 mmol/L (ref 134–144)
Total Protein: 6.8 g/dL (ref 6.0–8.5)

## 2019-09-26 DIAGNOSIS — Z1231 Encounter for screening mammogram for malignant neoplasm of breast: Secondary | ICD-10-CM | POA: Diagnosis not present

## 2019-10-01 ENCOUNTER — Other Ambulatory Visit: Payer: Self-pay | Admitting: Cardiovascular Disease

## 2019-10-01 ENCOUNTER — Telehealth: Payer: Self-pay | Admitting: Cardiovascular Disease

## 2019-10-01 DIAGNOSIS — E782 Mixed hyperlipidemia: Secondary | ICD-10-CM | POA: Diagnosis not present

## 2019-10-01 DIAGNOSIS — M160 Bilateral primary osteoarthritis of hip: Secondary | ICD-10-CM | POA: Diagnosis not present

## 2019-10-01 DIAGNOSIS — G4700 Insomnia, unspecified: Secondary | ICD-10-CM | POA: Diagnosis not present

## 2019-10-01 DIAGNOSIS — M4726 Other spondylosis with radiculopathy, lumbar region: Secondary | ICD-10-CM | POA: Diagnosis not present

## 2019-10-01 DIAGNOSIS — N183 Chronic kidney disease, stage 3 unspecified: Secondary | ICD-10-CM | POA: Diagnosis not present

## 2019-10-01 MED ORDER — ICOSAPENT ETHYL 1 G PO CAPS: 2.0000 g | ORAL_CAPSULE | Freq: Two times a day (BID) | ORAL | 1 refills | Status: DC

## 2019-10-01 MED ORDER — ATORVASTATIN CALCIUM 80 MG PO TABS: 80.0000 mg | ORAL_TABLET | Freq: Every day | ORAL | 1 refills | Status: DC

## 2019-10-01 NOTE — Telephone Encounter (Signed)
**Note De-Identified Tracy Rasmussen Obfuscation** Atorvastatin and Vascepa refills e-scribed to [facility] to fill  per their request.

## 2019-10-01 NOTE — Telephone Encounter (Signed)
   *  STAT* If patient is at the pharmacy, call can be transferred to refill team.   1. Which medications need to be refilled? (please list name of each medication and dose if known)   atorvastatin (LIPITOR) 80 MG tablet  icosapent Ethyl (VASCEPA) 1 g capsule   2. Which pharmacy/location (including street and city if local pharmacy) is medication to be sent to? [facility] - [place], [place] - [address] fax# [phone]  3. Do they need a 30 day or 90 day supply? 90 days

## 2019-10-24 DIAGNOSIS — E611 Iron deficiency: Secondary | ICD-10-CM | POA: Diagnosis not present

## 2019-10-24 DIAGNOSIS — Z923 Personal history of irradiation: Secondary | ICD-10-CM | POA: Diagnosis not present

## 2019-10-24 DIAGNOSIS — C84A9 Cutaneous T-cell lymphoma, unspecified, extranodal and solid organ sites: Secondary | ICD-10-CM | POA: Diagnosis not present

## 2019-10-24 DIAGNOSIS — Z79899 Other long term (current) drug therapy: Secondary | ICD-10-CM | POA: Diagnosis not present

## 2019-10-24 DIAGNOSIS — Z9889 Other specified postprocedural states: Secondary | ICD-10-CM | POA: Diagnosis not present

## 2019-10-24 DIAGNOSIS — C8519 Unspecified B-cell lymphoma, extranodal and solid organ sites: Secondary | ICD-10-CM | POA: Diagnosis not present

## 2019-10-28 DIAGNOSIS — N183 Chronic kidney disease, stage 3 unspecified: Secondary | ICD-10-CM | POA: Diagnosis not present

## 2019-10-28 DIAGNOSIS — E782 Mixed hyperlipidemia: Secondary | ICD-10-CM | POA: Diagnosis not present

## 2019-10-28 DIAGNOSIS — M160 Bilateral primary osteoarthritis of hip: Secondary | ICD-10-CM | POA: Diagnosis not present

## 2019-10-28 DIAGNOSIS — G4700 Insomnia, unspecified: Secondary | ICD-10-CM | POA: Diagnosis not present

## 2019-10-28 DIAGNOSIS — M4726 Other spondylosis with radiculopathy, lumbar region: Secondary | ICD-10-CM | POA: Diagnosis not present

## 2019-12-10 DIAGNOSIS — E782 Mixed hyperlipidemia: Secondary | ICD-10-CM | POA: Diagnosis not present

## 2019-12-10 DIAGNOSIS — M4726 Other spondylosis with radiculopathy, lumbar region: Secondary | ICD-10-CM | POA: Diagnosis not present

## 2019-12-10 DIAGNOSIS — N183 Chronic kidney disease, stage 3 unspecified: Secondary | ICD-10-CM | POA: Diagnosis not present

## 2019-12-10 DIAGNOSIS — M160 Bilateral primary osteoarthritis of hip: Secondary | ICD-10-CM | POA: Diagnosis not present

## 2019-12-10 DIAGNOSIS — G4700 Insomnia, unspecified: Secondary | ICD-10-CM | POA: Diagnosis not present

## 2020-01-02 DIAGNOSIS — E782 Mixed hyperlipidemia: Secondary | ICD-10-CM | POA: Diagnosis not present

## 2020-01-02 DIAGNOSIS — N183 Chronic kidney disease, stage 3 unspecified: Secondary | ICD-10-CM | POA: Diagnosis not present

## 2020-01-02 DIAGNOSIS — M4726 Other spondylosis with radiculopathy, lumbar region: Secondary | ICD-10-CM | POA: Diagnosis not present

## 2020-01-02 DIAGNOSIS — M160 Bilateral primary osteoarthritis of hip: Secondary | ICD-10-CM | POA: Diagnosis not present

## 2020-01-02 DIAGNOSIS — G4700 Insomnia, unspecified: Secondary | ICD-10-CM | POA: Diagnosis not present

## 2020-01-27 DIAGNOSIS — N183 Chronic kidney disease, stage 3 unspecified: Secondary | ICD-10-CM | POA: Diagnosis not present

## 2020-01-27 DIAGNOSIS — E782 Mixed hyperlipidemia: Secondary | ICD-10-CM | POA: Diagnosis not present

## 2020-01-27 DIAGNOSIS — M160 Bilateral primary osteoarthritis of hip: Secondary | ICD-10-CM | POA: Diagnosis not present

## 2020-01-27 DIAGNOSIS — M4726 Other spondylosis with radiculopathy, lumbar region: Secondary | ICD-10-CM | POA: Diagnosis not present

## 2020-01-27 DIAGNOSIS — G4700 Insomnia, unspecified: Secondary | ICD-10-CM | POA: Diagnosis not present

## 2020-02-10 DIAGNOSIS — C8519 Unspecified B-cell lymphoma, extranodal and solid organ sites: Secondary | ICD-10-CM | POA: Diagnosis not present

## 2020-02-24 DIAGNOSIS — M160 Bilateral primary osteoarthritis of hip: Secondary | ICD-10-CM | POA: Diagnosis not present

## 2020-02-24 DIAGNOSIS — N183 Chronic kidney disease, stage 3 unspecified: Secondary | ICD-10-CM | POA: Diagnosis not present

## 2020-02-24 DIAGNOSIS — M8580 Other specified disorders of bone density and structure, unspecified site: Secondary | ICD-10-CM | POA: Diagnosis not present

## 2020-02-24 DIAGNOSIS — E782 Mixed hyperlipidemia: Secondary | ICD-10-CM | POA: Diagnosis not present

## 2020-02-24 DIAGNOSIS — M4726 Other spondylosis with radiculopathy, lumbar region: Secondary | ICD-10-CM | POA: Diagnosis not present

## 2020-02-24 DIAGNOSIS — G4700 Insomnia, unspecified: Secondary | ICD-10-CM | POA: Diagnosis not present

## 2020-03-10 ENCOUNTER — Telehealth: Payer: Self-pay

## 2020-03-10 DIAGNOSIS — E782 Mixed hyperlipidemia: Secondary | ICD-10-CM

## 2020-03-10 NOTE — Progress Notes (Deleted)
Evaluation Performed:  Follow-up visit  Date:  03/10/2020   ID:  Tracy Rasmussen, DOB 1950/11/07, MRN [id number]   PCP:  Kathyrn Lass, MD  Cardiologist:  Johnsie Cancel Electrophysiologist:  None   Chief Complaint:  HLD  History of Present Illness:    Tracy Rasmussen is a 69 y.o. female  has had Rx for lipids since age 52 She has had issues with depression estranged from sister in [place]. Activity limited by arthritis. Had right THR done January 2020   Furlowed from [place] alone Walking around neighborhood  Received COVID Vaccine end of March   Primary issue has been elevated triglycerides. Had elevated LFT;s in past and Niacin avoided Rx with lipitor 80 mg, fenofibrate 160 mg and Lovaza.   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 Anxiety disorder   . Complication of anesthesia   . Mixed hyperlipidemia   . Osteopenia   . Over weight   . PONV (postoperative nausea and vomiting)   . Pulmonary nodules   . Small lymphocytic B-cell lymphoma involving skin ([place])   . Spondylolisthesis of lumbar region   . Vitamin D deficiency    Past Surgical History:  Procedure Laterality Date  . BACK SURGERY  1994   L5, S1 herniated disc  . BACK SURGERY     Herniated Disc 2018  . BACK SURGERY     Fusion L4 & L5, Herniated Disc 2019  . BREAST BIOPSY  2006   benign  . CRYOTHERAPY  1980's  . TOTAL HIP ARTHROPLASTY Right 05/08/2018   Procedure: TOTAL HIP ARTHROPLASTY ANTERIOR APPROACH;  Surgeon: Melrose Nakayama, MD;  Location: [facility];  Service: Orthopedics;  Laterality: Right;  . WISDOM TOOTH EXTRACTION     teenage years     No outpatient medications have been marked as taking for the 03/12/20 encounter (Appointment) with Josue Hector, MD.      Allergies:   Iodine and Ivp dye [iodinated diagnostic agents]   Social History   Tobacco Use  . Smoking status: Former Research scientist (life sciences)  . Smokeless tobacco: Former Systems developer  . Tobacco comment: quit 2005  Vaping Use  . Vaping Use: Never used  Substance Use Topics  . Alcohol use: Yes    Alcohol/week: 0.0 standard drinks    Comment: social  . Drug use: No     Family Hx: The patient's family history includes Kidney disease in her father; Pancreatic cancer in her mother; Thyroid disease in her sister. There is no history of Colon cancer, Colon polyps, or Liver disease.  ROS:   Please see the history of present illness.     All other systems reviewed and are negative.   Prior CV studies:   The following studies were reviewed today:  None  Labs/Other Tests and Data Reviewed:    EKG: 06/14/17 NSR rate 67 normal ECG   Recent Labs: 09/18/2019: ALT 28; BUN 23; Creatinine, Ser 0.90; Potassium 4.9; Sodium 140   Recent Lipid Panel Lab Results  Component Value Date/Time   CHOL 165 09/18/2019 07:38 AM   TRIG 404 (H) 09/18/2019 07:38 AM   HDL 28 (L) 09/18/2019 07:38 AM   CHOLHDL 5.9 (H) 09/18/2019 07:38 AM   CHOLHDL 5.5 (H)  02/15/2016 07:33 AM   LDLCALC 73 09/18/2019 07:38 AM   LDLDIRECT 56 12/19/2016 07:51 AM   LDLDIRECT 86 08/25/2015 07:46 AM    Wt Readings from Last 3 Encounters:  09/11/19 78.9 kg  07/26/18 73.9 kg  05/08/18 72.1 kg     Objective:    Vital Signs:  There were no vitals taken for this visit.   Affect appropriate Healthy:  appears stated age 76: normal Neck supple with no adenopathy JVP normal no bruits no thyromegaly Lungs clear with no wheezing and good diaphragmatic motion Heart:  S1/S2 no murmur, no rub, gallop or click PMI normal Abdomen: benighn, BS positve, no tenderness, no AAA no bruit.  No HSM or HJR Distal pulses intact with no bruits No edema Neuro non-focal Skin warm and dry No muscular weakness  ECG:  NSR rate 60 normal ECG  09/11/19    ASSESSMENT & PLAN:    1. Lipids:  Primary issue will see if she can start seeing Dr Debara Pickett who is board certified in lipidology. Will f/u       labs now that she is on Vascepa 2. Ortho:  Post THR on right PT/OT Tramadol for pain     Medication Adjustments/Labs and Tests Ordered: Current medicines are reviewed at length with the patient today.  Concerns regarding medicines are outlined above.   Tests Ordered:  Lipids LFTls   Medication Changes: No orders of the defined types were placed in this encounter.   Disposition:  Follow up with Dr Debara Pickett and lipid clinic 6 months   Signed, Jenkins Rouge, MD  03/10/2020 3:36 PM    [place]

## 2020-03-10 NOTE — Telephone Encounter (Signed)
-----   Message from Josue Hector, MD sent at 03/10/2020  3:40 PM EST ----- Patient was supposed to f/u with Dr Debara Pickett for lipids which is her only issue Please take off my schedule this week and have her see him has she had lipid/liver since being on Vascepa ?

## 2020-03-10 NOTE — Telephone Encounter (Signed)
Left message for patient to call back  

## 2020-03-11 NOTE — Telephone Encounter (Signed)
Called patient again about Dr. Kyla Balzarine message. Will put in referral for Dr. Debara Pickett. Appointment canceled for tomorrow.

## 2020-03-12 ENCOUNTER — Ambulatory Visit: Payer: PPO | Admitting: Cardiovascular Disease

## 2020-03-14 DIAGNOSIS — M25551 Pain in right hip: Secondary | ICD-10-CM | POA: Diagnosis not present

## 2020-03-23 ENCOUNTER — Other Ambulatory Visit: Payer: Self-pay | Admitting: Cardiovascular Disease

## 2020-03-24 MED ORDER — ICOSAPENT ETHYL 1 G PO CAPS: 2.0000 g | ORAL_CAPSULE | Freq: Two times a day (BID) | ORAL | 1 refills | Status: DC

## 2020-03-25 ENCOUNTER — Encounter: Payer: Self-pay | Admitting: Internal Medicine

## 2020-03-26 DIAGNOSIS — M160 Bilateral primary osteoarthritis of hip: Secondary | ICD-10-CM | POA: Diagnosis not present

## 2020-03-26 DIAGNOSIS — N183 Chronic kidney disease, stage 3 unspecified: Secondary | ICD-10-CM | POA: Diagnosis not present

## 2020-03-26 DIAGNOSIS — M8580 Other specified disorders of bone density and structure, unspecified site: Secondary | ICD-10-CM | POA: Diagnosis not present

## 2020-03-26 DIAGNOSIS — E782 Mixed hyperlipidemia: Secondary | ICD-10-CM | POA: Diagnosis not present

## 2020-03-26 DIAGNOSIS — M4726 Other spondylosis with radiculopathy, lumbar region: Secondary | ICD-10-CM | POA: Diagnosis not present

## 2020-03-26 DIAGNOSIS — G4700 Insomnia, unspecified: Secondary | ICD-10-CM | POA: Diagnosis not present

## 2020-04-24 DIAGNOSIS — E782 Mixed hyperlipidemia: Secondary | ICD-10-CM | POA: Diagnosis not present

## 2020-04-24 DIAGNOSIS — M8580 Other specified disorders of bone density and structure, unspecified site: Secondary | ICD-10-CM | POA: Diagnosis not present

## 2020-04-24 DIAGNOSIS — M4726 Other spondylosis with radiculopathy, lumbar region: Secondary | ICD-10-CM | POA: Diagnosis not present

## 2020-04-24 DIAGNOSIS — G4700 Insomnia, unspecified: Secondary | ICD-10-CM | POA: Diagnosis not present

## 2020-04-24 DIAGNOSIS — M160 Bilateral primary osteoarthritis of hip: Secondary | ICD-10-CM | POA: Diagnosis not present

## 2020-04-24 DIAGNOSIS — N183 Chronic kidney disease, stage 3 unspecified: Secondary | ICD-10-CM | POA: Diagnosis not present

## 2020-05-25 DIAGNOSIS — N183 Chronic kidney disease, stage 3 unspecified: Secondary | ICD-10-CM | POA: Diagnosis not present

## 2020-05-25 DIAGNOSIS — E782 Mixed hyperlipidemia: Secondary | ICD-10-CM | POA: Diagnosis not present

## 2020-05-25 DIAGNOSIS — G4700 Insomnia, unspecified: Secondary | ICD-10-CM | POA: Diagnosis not present

## 2020-05-25 DIAGNOSIS — M8580 Other specified disorders of bone density and structure, unspecified site: Secondary | ICD-10-CM | POA: Diagnosis not present

## 2020-05-25 DIAGNOSIS — M160 Bilateral primary osteoarthritis of hip: Secondary | ICD-10-CM | POA: Diagnosis not present

## 2020-05-25 DIAGNOSIS — M4726 Other spondylosis with radiculopathy, lumbar region: Secondary | ICD-10-CM | POA: Diagnosis not present

## 2020-06-13 DIAGNOSIS — H5713 Ocular pain, bilateral: Secondary | ICD-10-CM | POA: Diagnosis not present

## 2020-07-08 DIAGNOSIS — G4700 Insomnia, unspecified: Secondary | ICD-10-CM | POA: Diagnosis not present

## 2020-07-08 DIAGNOSIS — N183 Chronic kidney disease, stage 3 unspecified: Secondary | ICD-10-CM | POA: Diagnosis not present

## 2020-07-08 DIAGNOSIS — M8580 Other specified disorders of bone density and structure, unspecified site: Secondary | ICD-10-CM | POA: Diagnosis not present

## 2020-07-08 DIAGNOSIS — E782 Mixed hyperlipidemia: Secondary | ICD-10-CM | POA: Diagnosis not present

## 2020-07-08 DIAGNOSIS — M4726 Other spondylosis with radiculopathy, lumbar region: Secondary | ICD-10-CM | POA: Diagnosis not present

## 2020-07-08 DIAGNOSIS — M160 Bilateral primary osteoarthritis of hip: Secondary | ICD-10-CM | POA: Diagnosis not present

## 2020-07-11 DIAGNOSIS — H524 Presbyopia: Secondary | ICD-10-CM | POA: Diagnosis not present

## 2020-08-07 DIAGNOSIS — E782 Mixed hyperlipidemia: Secondary | ICD-10-CM | POA: Diagnosis not present

## 2020-08-07 DIAGNOSIS — G4700 Insomnia, unspecified: Secondary | ICD-10-CM | POA: Diagnosis not present

## 2020-08-07 DIAGNOSIS — M160 Bilateral primary osteoarthritis of hip: Secondary | ICD-10-CM | POA: Diagnosis not present

## 2020-08-07 DIAGNOSIS — M8580 Other specified disorders of bone density and structure, unspecified site: Secondary | ICD-10-CM | POA: Diagnosis not present

## 2020-08-07 DIAGNOSIS — N183 Chronic kidney disease, stage 3 unspecified: Secondary | ICD-10-CM | POA: Diagnosis not present

## 2020-08-07 DIAGNOSIS — M4726 Other spondylosis with radiculopathy, lumbar region: Secondary | ICD-10-CM | POA: Diagnosis not present

## 2020-08-21 DIAGNOSIS — N183 Chronic kidney disease, stage 3 unspecified: Secondary | ICD-10-CM | POA: Diagnosis not present

## 2020-08-21 DIAGNOSIS — M8580 Other specified disorders of bone density and structure, unspecified site: Secondary | ICD-10-CM | POA: Diagnosis not present

## 2020-08-21 DIAGNOSIS — E782 Mixed hyperlipidemia: Secondary | ICD-10-CM | POA: Diagnosis not present

## 2020-08-21 DIAGNOSIS — G4700 Insomnia, unspecified: Secondary | ICD-10-CM | POA: Diagnosis not present

## 2020-08-21 DIAGNOSIS — M4726 Other spondylosis with radiculopathy, lumbar region: Secondary | ICD-10-CM | POA: Diagnosis not present

## 2020-08-21 DIAGNOSIS — M160 Bilateral primary osteoarthritis of hip: Secondary | ICD-10-CM | POA: Diagnosis not present

## 2020-08-24 DIAGNOSIS — E6609 Other obesity due to excess calories: Secondary | ICD-10-CM | POA: Diagnosis not present

## 2020-08-24 DIAGNOSIS — E559 Vitamin D deficiency, unspecified: Secondary | ICD-10-CM | POA: Diagnosis not present

## 2020-08-24 DIAGNOSIS — Z6832 Body mass index (BMI) 32.0-32.9, adult: Secondary | ICD-10-CM | POA: Diagnosis not present

## 2020-08-24 DIAGNOSIS — Z1389 Encounter for screening for other disorder: Secondary | ICD-10-CM | POA: Diagnosis not present

## 2020-08-24 DIAGNOSIS — G4700 Insomnia, unspecified: Secondary | ICD-10-CM | POA: Diagnosis not present

## 2020-08-24 DIAGNOSIS — Z0000 Encounter for general adult medical examination without abnormal findings: Secondary | ICD-10-CM | POA: Diagnosis not present

## 2020-08-24 DIAGNOSIS — F419 Anxiety disorder, unspecified: Secondary | ICD-10-CM | POA: Diagnosis not present

## 2020-08-24 DIAGNOSIS — R7303 Prediabetes: Secondary | ICD-10-CM | POA: Diagnosis not present

## 2020-08-24 DIAGNOSIS — E669 Obesity, unspecified: Secondary | ICD-10-CM | POA: Diagnosis not present

## 2020-08-24 DIAGNOSIS — N183 Chronic kidney disease, stage 3 unspecified: Secondary | ICD-10-CM | POA: Diagnosis not present

## 2020-08-24 DIAGNOSIS — E782 Mixed hyperlipidemia: Secondary | ICD-10-CM | POA: Diagnosis not present

## 2020-09-29 ENCOUNTER — Other Ambulatory Visit: Payer: Self-pay | Admitting: Cardiovascular Disease

## 2020-09-30 DIAGNOSIS — M85852 Other specified disorders of bone density and structure, left thigh: Secondary | ICD-10-CM | POA: Diagnosis not present

## 2020-09-30 DIAGNOSIS — Z1231 Encounter for screening mammogram for malignant neoplasm of breast: Secondary | ICD-10-CM | POA: Diagnosis not present

## 2020-09-30 DIAGNOSIS — M8589 Other specified disorders of bone density and structure, multiple sites: Secondary | ICD-10-CM | POA: Diagnosis not present

## 2020-10-01 IMAGING — CT CT MAXILLOFACIAL W/O CM
3 of 5 series · 14 of 47 positions shown, 16 images · non-contrast
Comparison: None.

CLINICAL DATA: 68-year-old female with maxillary sinus pain and
pressure. History of B-cell lymphoma.

EXAM:
CT MAXILLOFACIAL WITHOUT CONTRAST
TECHNIQUE: Multidetector CT images of the paranasal sinuses were obtained using
the standard protocol without intravenous contrast.

[Series 2: sinus 2.00 hr60 s3 axial · axial · 0.31mm/px · z∈[-647,-549]mm · 8 of 65 slices shown, 10 images]
[im 8/65  brain]
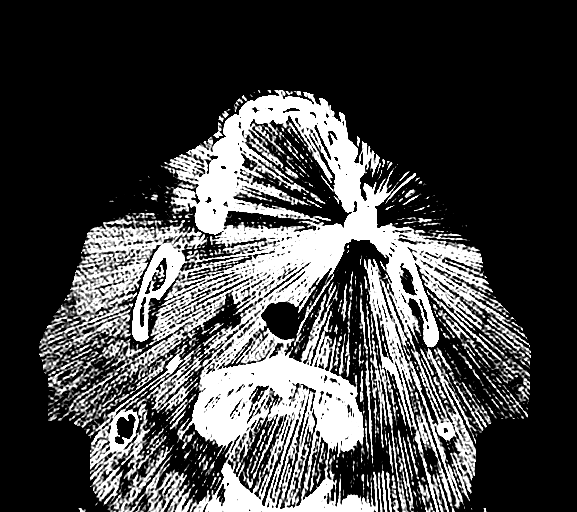
[im 8/65  bone]
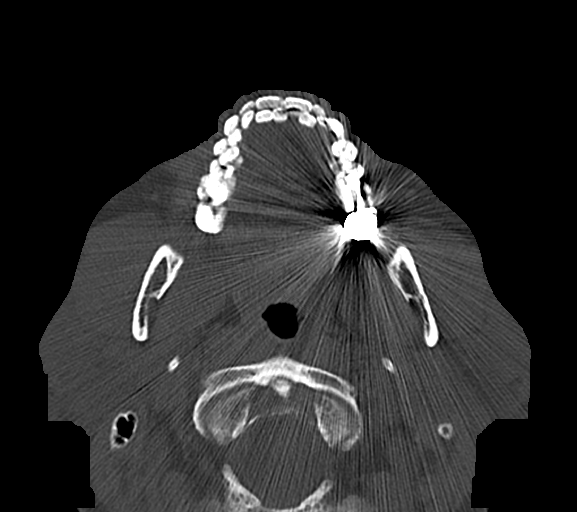
[im 15/65  bone]
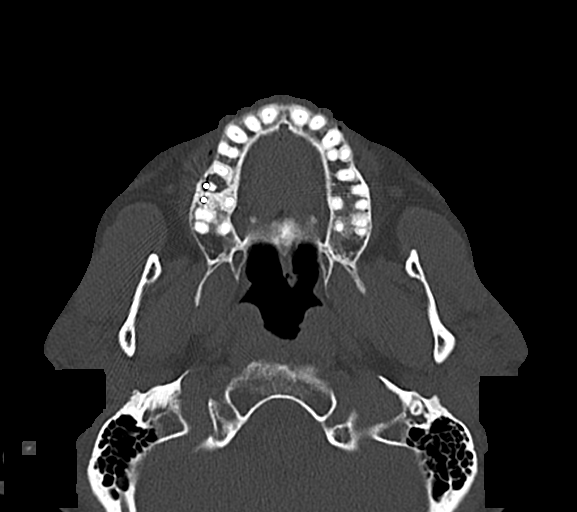
[im 22/65  bone]
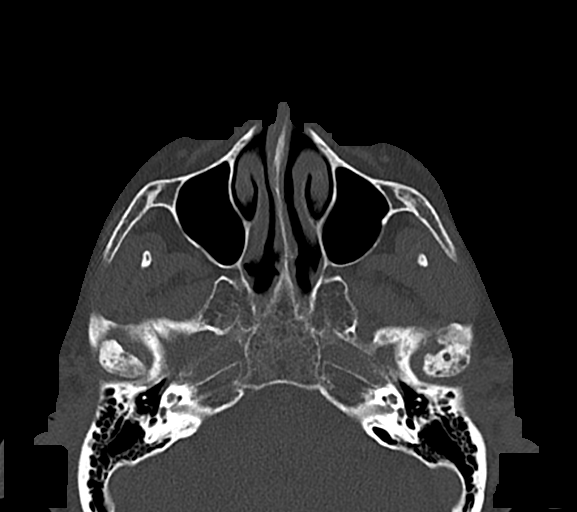
[im 29/65  bone]
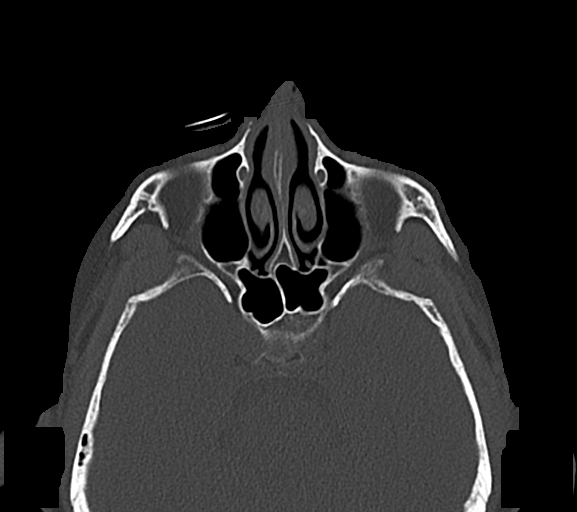
[im 36/65  brain]
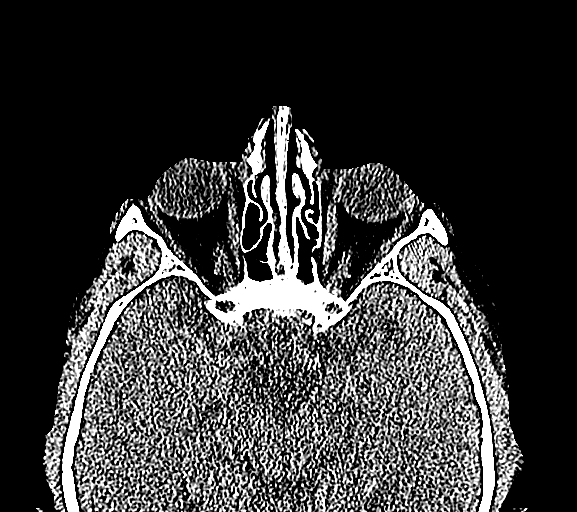
[im 36/65  bone]
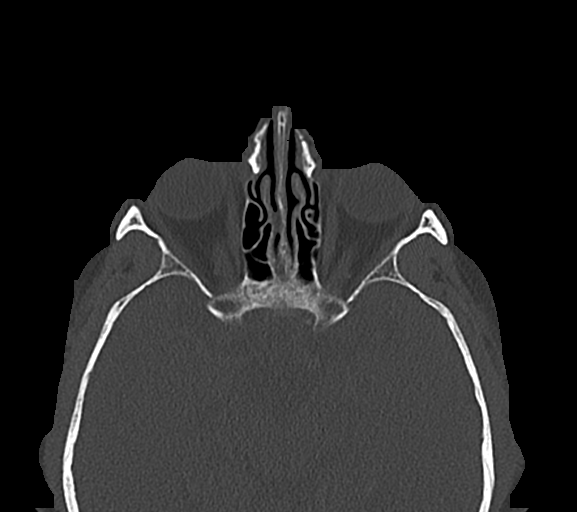
[im 43/65  bone]
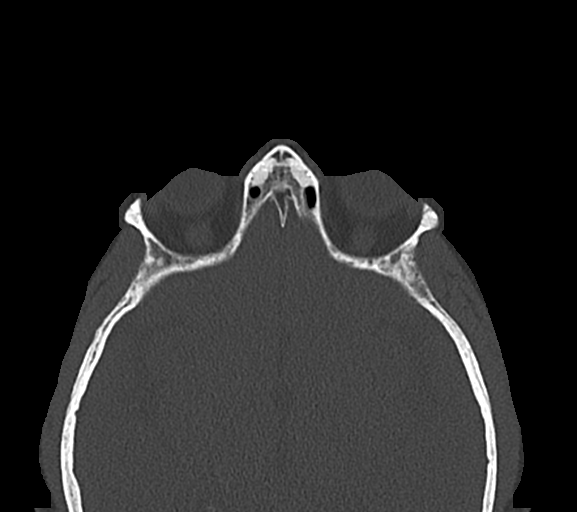
[im 50/65  bone]
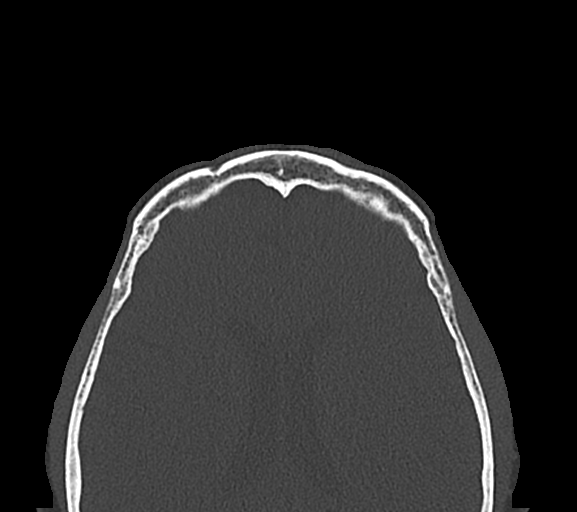
[im 57/65  bone]
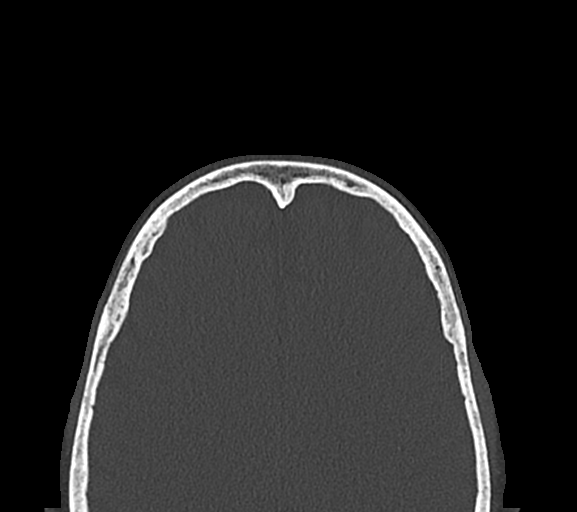

[Series 4: sinus 2.00 hr60 s3 cor · coronal · 0.25mm/px · 3 of 80 slices shown]
[im 27/80  bone]
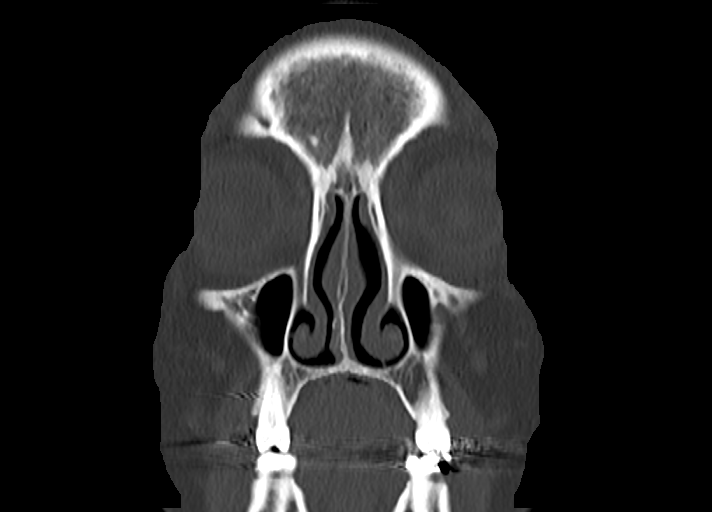
[im 36/80  bone]
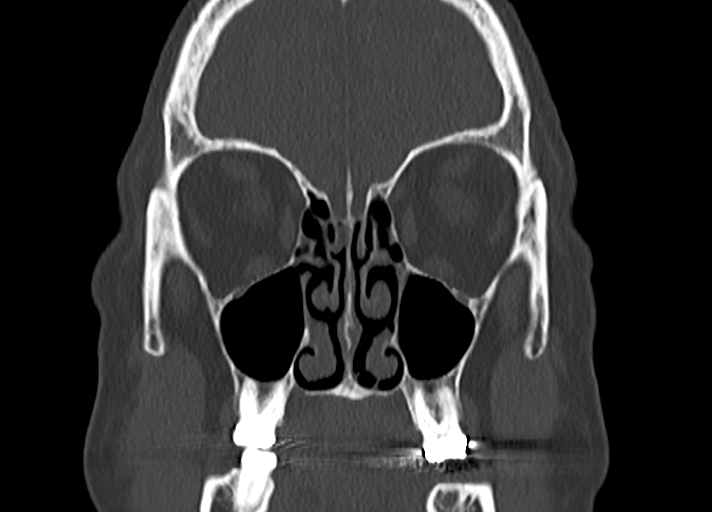
[im 44/80  bone]
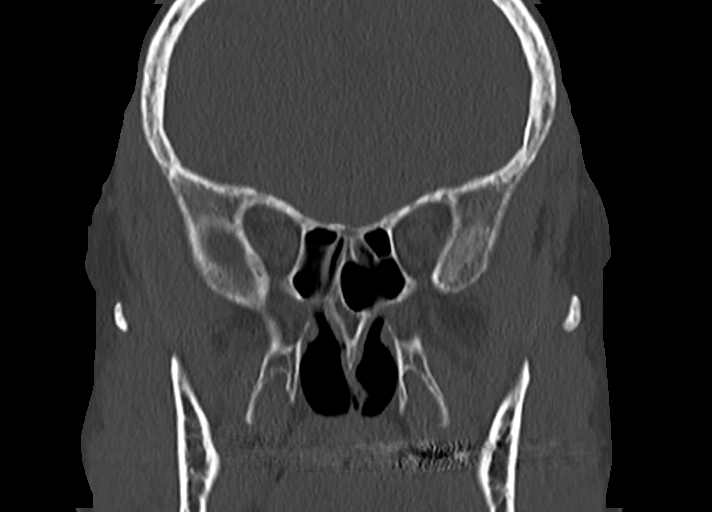

[Series 6: sinus 2.00 hr60 s3 sag · sagittal · 0.25mm/px · 3 of 90 slices shown]
[im 30/90  bone]
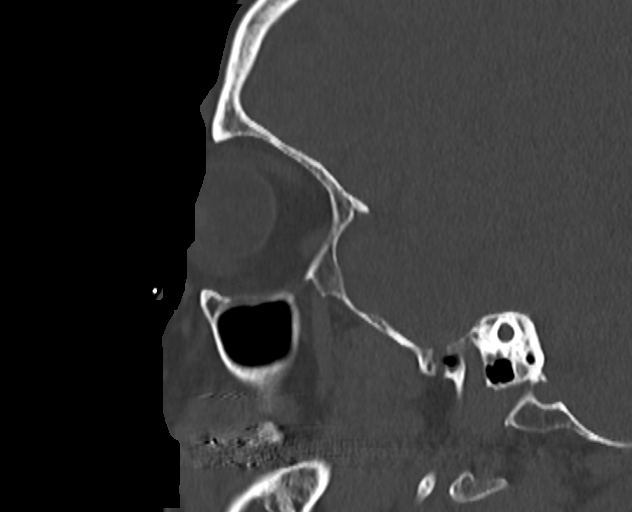
[im 45/90  bone]
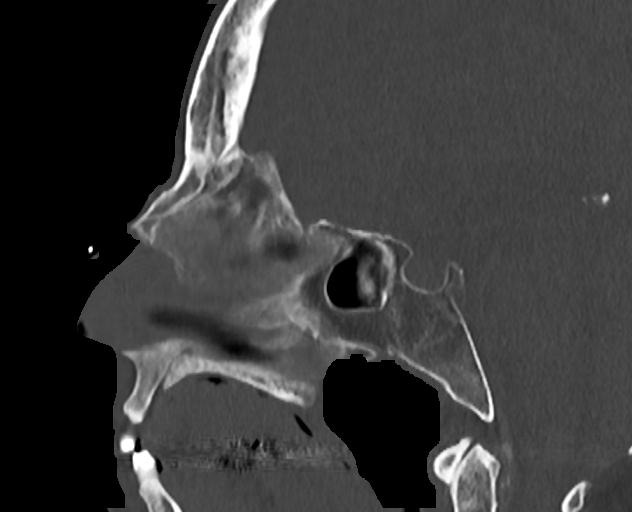
[im 60/90  bone]
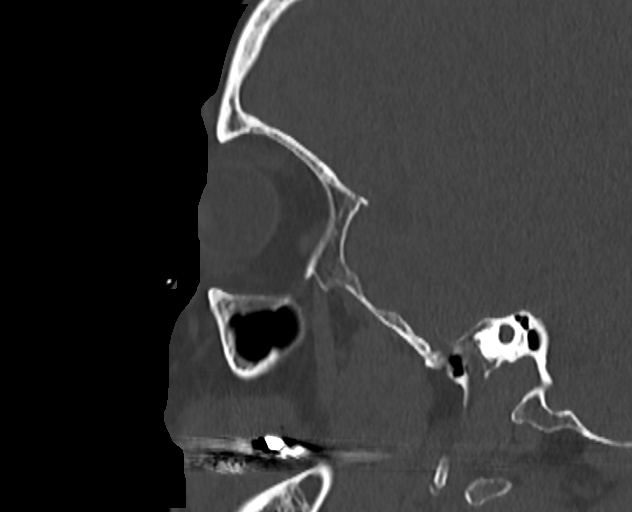

[14 of 47 positions shown; findings below may reference images not displayed]

FINDINGS: Paranasal sinuses:

Frontal: Hypoplastic but without mucosal thickening. Patent frontal
sinus drainage pathways.

Ethmoid: Normally aerated.

Maxillary: Normally aerated.

Sphenoid: Normally aerated. Patent sphenoethmoidal recesses.

Right ostiomeatal unit: Patent on coronal image 31.

Left ostiomeatal unit: Patent, same image.

Nasal passages: Nasal septum is intact and midline. There are
retained secretions in the bilateral nasal cavity with mild but
symmetric nasal cavity mucosal thickening (coronal image 35).
Retained secretions track into the nasopharynx which otherwise
appears negative. Partially opacified right olfactory recess. Intact
nasal septum is midline.

Anatomy:

Anterior ethmoidal artery position suspected on coronal image 37
with no pneumatization superior to the notches.

Keros type 2 olfactory fossa.

Presellar sphenoid pneumatization pattern.

Both frontal sinuses are largely hypoplastic.

Other: Calcified atherosclerosis at the skull base. Negative visible
noncontrast brain parenchyma. Visualized orbits and scalp soft
tissues are within normal limits. Negative visible noncontrast deep
soft tissue spaces of the face.

Bilateral tympanic cavities and mastoids are clear.

No acute maxillary dental finding. There is periapical lucency at
the right anterior maxillary molar with prior root canal there.

Severe left and mild-to-moderate right TMJ degeneration. No acute
osseous abnormality identified.
IMPRESSION: 1. Somewhat hypoplastic but normally aerated paranasal sinuses.
Patent sinus drainage pathways.
2. Possible acute rhinitis with retained secretions in the nasal
cavity and mild but symmetric mucosal thickening.
3. Bilateral TMJ degeneration, severe on the left.

## 2020-10-07 DIAGNOSIS — E782 Mixed hyperlipidemia: Secondary | ICD-10-CM | POA: Diagnosis not present

## 2020-10-07 DIAGNOSIS — G4700 Insomnia, unspecified: Secondary | ICD-10-CM | POA: Diagnosis not present

## 2020-10-07 DIAGNOSIS — M160 Bilateral primary osteoarthritis of hip: Secondary | ICD-10-CM | POA: Diagnosis not present

## 2020-10-07 DIAGNOSIS — N183 Chronic kidney disease, stage 3 unspecified: Secondary | ICD-10-CM | POA: Diagnosis not present

## 2020-10-07 DIAGNOSIS — M4726 Other spondylosis with radiculopathy, lumbar region: Secondary | ICD-10-CM | POA: Diagnosis not present

## 2020-10-07 DIAGNOSIS — M8580 Other specified disorders of bone density and structure, unspecified site: Secondary | ICD-10-CM | POA: Diagnosis not present

## 2020-10-13 ENCOUNTER — Other Ambulatory Visit: Payer: Self-pay | Admitting: Cardiovascular Disease

## 2020-10-22 DIAGNOSIS — E782 Mixed hyperlipidemia: Secondary | ICD-10-CM | POA: Diagnosis not present

## 2020-10-22 DIAGNOSIS — N183 Chronic kidney disease, stage 3 unspecified: Secondary | ICD-10-CM | POA: Diagnosis not present

## 2020-10-22 DIAGNOSIS — M160 Bilateral primary osteoarthritis of hip: Secondary | ICD-10-CM | POA: Diagnosis not present

## 2020-10-22 DIAGNOSIS — M8580 Other specified disorders of bone density and structure, unspecified site: Secondary | ICD-10-CM | POA: Diagnosis not present

## 2020-10-22 DIAGNOSIS — M4726 Other spondylosis with radiculopathy, lumbar region: Secondary | ICD-10-CM | POA: Diagnosis not present

## 2020-10-22 DIAGNOSIS — G4700 Insomnia, unspecified: Secondary | ICD-10-CM | POA: Diagnosis not present

## 2020-11-19 DIAGNOSIS — M4726 Other spondylosis with radiculopathy, lumbar region: Secondary | ICD-10-CM | POA: Diagnosis not present

## 2020-11-19 DIAGNOSIS — M8580 Other specified disorders of bone density and structure, unspecified site: Secondary | ICD-10-CM | POA: Diagnosis not present

## 2020-11-19 DIAGNOSIS — E782 Mixed hyperlipidemia: Secondary | ICD-10-CM | POA: Diagnosis not present

## 2020-11-19 DIAGNOSIS — M160 Bilateral primary osteoarthritis of hip: Secondary | ICD-10-CM | POA: Diagnosis not present

## 2020-11-19 DIAGNOSIS — G4700 Insomnia, unspecified: Secondary | ICD-10-CM | POA: Diagnosis not present

## 2020-11-19 DIAGNOSIS — N183 Chronic kidney disease, stage 3 unspecified: Secondary | ICD-10-CM | POA: Diagnosis not present

## 2020-11-20 ENCOUNTER — Other Ambulatory Visit: Payer: Self-pay | Admitting: Cardiovascular Disease

## 2020-12-11 ENCOUNTER — Other Ambulatory Visit: Payer: Self-pay | Admitting: Cardiovascular Disease

## 2020-12-17 DIAGNOSIS — N183 Chronic kidney disease, stage 3 unspecified: Secondary | ICD-10-CM | POA: Diagnosis not present

## 2020-12-17 DIAGNOSIS — M8580 Other specified disorders of bone density and structure, unspecified site: Secondary | ICD-10-CM | POA: Diagnosis not present

## 2020-12-17 DIAGNOSIS — M4726 Other spondylosis with radiculopathy, lumbar region: Secondary | ICD-10-CM | POA: Diagnosis not present

## 2020-12-17 DIAGNOSIS — G4700 Insomnia, unspecified: Secondary | ICD-10-CM | POA: Diagnosis not present

## 2020-12-17 DIAGNOSIS — E782 Mixed hyperlipidemia: Secondary | ICD-10-CM | POA: Diagnosis not present

## 2020-12-17 DIAGNOSIS — M160 Bilateral primary osteoarthritis of hip: Secondary | ICD-10-CM | POA: Diagnosis not present

## 2021-01-05 ENCOUNTER — Other Ambulatory Visit: Payer: Self-pay | Admitting: Cardiovascular Disease

## 2021-01-05 MED ORDER — ICOSAPENT ETHYL 1 G PO CAPS: 2.0000 g | ORAL_CAPSULE | Freq: Two times a day (BID) | ORAL | 0 refills | Status: DC

## 2021-01-14 DIAGNOSIS — M8580 Other specified disorders of bone density and structure, unspecified site: Secondary | ICD-10-CM | POA: Diagnosis not present

## 2021-01-14 DIAGNOSIS — E782 Mixed hyperlipidemia: Secondary | ICD-10-CM | POA: Diagnosis not present

## 2021-01-14 DIAGNOSIS — M160 Bilateral primary osteoarthritis of hip: Secondary | ICD-10-CM | POA: Diagnosis not present

## 2021-01-14 DIAGNOSIS — G4700 Insomnia, unspecified: Secondary | ICD-10-CM | POA: Diagnosis not present

## 2021-01-14 DIAGNOSIS — M4726 Other spondylosis with radiculopathy, lumbar region: Secondary | ICD-10-CM | POA: Diagnosis not present

## 2021-01-14 DIAGNOSIS — N183 Chronic kidney disease, stage 3 unspecified: Secondary | ICD-10-CM | POA: Diagnosis not present

## 2021-02-15 DIAGNOSIS — M160 Bilateral primary osteoarthritis of hip: Secondary | ICD-10-CM | POA: Diagnosis not present

## 2021-02-15 DIAGNOSIS — E782 Mixed hyperlipidemia: Secondary | ICD-10-CM | POA: Diagnosis not present

## 2021-02-15 DIAGNOSIS — Z08 Encounter for follow-up examination after completed treatment for malignant neoplasm: Secondary | ICD-10-CM | POA: Diagnosis not present

## 2021-02-15 DIAGNOSIS — C8519 Unspecified B-cell lymphoma, extranodal and solid organ sites: Secondary | ICD-10-CM | POA: Diagnosis not present

## 2021-02-15 DIAGNOSIS — M8580 Other specified disorders of bone density and structure, unspecified site: Secondary | ICD-10-CM | POA: Diagnosis not present

## 2021-02-15 DIAGNOSIS — D509 Iron deficiency anemia, unspecified: Secondary | ICD-10-CM | POA: Diagnosis not present

## 2021-02-15 DIAGNOSIS — E611 Iron deficiency: Secondary | ICD-10-CM | POA: Diagnosis not present

## 2021-02-15 DIAGNOSIS — M4726 Other spondylosis with radiculopathy, lumbar region: Secondary | ICD-10-CM | POA: Diagnosis not present

## 2021-02-15 DIAGNOSIS — Z8572 Personal history of non-Hodgkin lymphomas: Secondary | ICD-10-CM | POA: Diagnosis not present

## 2021-02-15 DIAGNOSIS — G4700 Insomnia, unspecified: Secondary | ICD-10-CM | POA: Diagnosis not present

## 2021-02-15 DIAGNOSIS — R718 Other abnormality of red blood cells: Secondary | ICD-10-CM | POA: Diagnosis not present

## 2021-02-15 DIAGNOSIS — N183 Chronic kidney disease, stage 3 unspecified: Secondary | ICD-10-CM | POA: Diagnosis not present

## 2021-03-15 DIAGNOSIS — E782 Mixed hyperlipidemia: Secondary | ICD-10-CM | POA: Diagnosis not present

## 2021-03-15 DIAGNOSIS — G4700 Insomnia, unspecified: Secondary | ICD-10-CM | POA: Diagnosis not present

## 2021-03-15 DIAGNOSIS — N183 Chronic kidney disease, stage 3 unspecified: Secondary | ICD-10-CM | POA: Diagnosis not present

## 2021-04-12 ENCOUNTER — Other Ambulatory Visit: Payer: Self-pay | Admitting: Cardiovascular Disease

## 2021-04-14 MED ORDER — ICOSAPENT ETHYL 1 G PO CAPS: 2.0000 g | ORAL_CAPSULE | Freq: Two times a day (BID) | ORAL | 0 refills | Status: AC

## 2021-05-10 DIAGNOSIS — N183 Chronic kidney disease, stage 3 unspecified: Secondary | ICD-10-CM | POA: Diagnosis not present

## 2021-05-10 DIAGNOSIS — E782 Mixed hyperlipidemia: Secondary | ICD-10-CM | POA: Diagnosis not present

## 2021-05-10 DIAGNOSIS — M160 Bilateral primary osteoarthritis of hip: Secondary | ICD-10-CM | POA: Diagnosis not present

## 2021-05-10 DIAGNOSIS — G4700 Insomnia, unspecified: Secondary | ICD-10-CM | POA: Diagnosis not present

## 2021-07-02 DIAGNOSIS — Z85828 Personal history of other malignant neoplasm of skin: Secondary | ICD-10-CM | POA: Diagnosis not present

## 2021-07-02 DIAGNOSIS — L821 Other seborrheic keratosis: Secondary | ICD-10-CM | POA: Diagnosis not present

## 2021-07-08 DIAGNOSIS — E782 Mixed hyperlipidemia: Secondary | ICD-10-CM | POA: Diagnosis not present

## 2021-07-08 DIAGNOSIS — N183 Chronic kidney disease, stage 3 unspecified: Secondary | ICD-10-CM | POA: Diagnosis not present

## 2021-07-12 DIAGNOSIS — D509 Iron deficiency anemia, unspecified: Secondary | ICD-10-CM | POA: Diagnosis not present

## 2021-07-26 DIAGNOSIS — R7303 Prediabetes: Secondary | ICD-10-CM | POA: Diagnosis not present

## 2021-07-26 DIAGNOSIS — E782 Mixed hyperlipidemia: Secondary | ICD-10-CM | POA: Diagnosis not present

## 2021-07-26 DIAGNOSIS — N183 Chronic kidney disease, stage 3 unspecified: Secondary | ICD-10-CM | POA: Diagnosis not present

## 2021-07-26 DIAGNOSIS — G4700 Insomnia, unspecified: Secondary | ICD-10-CM | POA: Diagnosis not present

## 2021-08-27 DIAGNOSIS — E669 Obesity, unspecified: Secondary | ICD-10-CM | POA: Diagnosis not present

## 2021-08-27 DIAGNOSIS — Z1389 Encounter for screening for other disorder: Secondary | ICD-10-CM | POA: Diagnosis not present

## 2021-08-27 DIAGNOSIS — F419 Anxiety disorder, unspecified: Secondary | ICD-10-CM | POA: Diagnosis not present

## 2021-08-27 DIAGNOSIS — Z0000 Encounter for general adult medical examination without abnormal findings: Secondary | ICD-10-CM | POA: Diagnosis not present

## 2021-08-27 DIAGNOSIS — E559 Vitamin D deficiency, unspecified: Secondary | ICD-10-CM | POA: Diagnosis not present

## 2021-08-27 DIAGNOSIS — Z6832 Body mass index (BMI) 32.0-32.9, adult: Secondary | ICD-10-CM | POA: Diagnosis not present

## 2021-08-27 DIAGNOSIS — R739 Hyperglycemia, unspecified: Secondary | ICD-10-CM | POA: Diagnosis not present

## 2021-08-27 DIAGNOSIS — E782 Mixed hyperlipidemia: Secondary | ICD-10-CM | POA: Diagnosis not present

## 2021-08-27 DIAGNOSIS — M8580 Other specified disorders of bone density and structure, unspecified site: Secondary | ICD-10-CM | POA: Diagnosis not present

## 2021-08-27 DIAGNOSIS — C8589 Other specified types of non-Hodgkin lymphoma, extranodal and solid organ sites: Secondary | ICD-10-CM | POA: Diagnosis not present

## 2021-09-07 DIAGNOSIS — G4700 Insomnia, unspecified: Secondary | ICD-10-CM | POA: Diagnosis not present

## 2021-09-07 DIAGNOSIS — E782 Mixed hyperlipidemia: Secondary | ICD-10-CM | POA: Diagnosis not present

## 2021-09-07 DIAGNOSIS — F419 Anxiety disorder, unspecified: Secondary | ICD-10-CM | POA: Diagnosis not present

## 2021-09-07 DIAGNOSIS — M8580 Other specified disorders of bone density and structure, unspecified site: Secondary | ICD-10-CM | POA: Diagnosis not present

## 2021-09-10 DIAGNOSIS — L219 Seborrheic dermatitis, unspecified: Secondary | ICD-10-CM | POA: Diagnosis not present

## 2021-09-22 DIAGNOSIS — F419 Anxiety disorder, unspecified: Secondary | ICD-10-CM | POA: Diagnosis not present

## 2021-09-22 DIAGNOSIS — E782 Mixed hyperlipidemia: Secondary | ICD-10-CM | POA: Diagnosis not present

## 2021-09-28 DIAGNOSIS — Z1231 Encounter for screening mammogram for malignant neoplasm of breast: Secondary | ICD-10-CM | POA: Diagnosis not present

## 2021-09-30 DIAGNOSIS — B349 Viral infection, unspecified: Secondary | ICD-10-CM | POA: Diagnosis not present

## 2021-10-06 DIAGNOSIS — H6692 Otitis media, unspecified, left ear: Secondary | ICD-10-CM | POA: Diagnosis not present

## 2021-12-21 DIAGNOSIS — H524 Presbyopia: Secondary | ICD-10-CM | POA: Diagnosis not present

## 2022-02-14 DIAGNOSIS — R718 Other abnormality of red blood cells: Secondary | ICD-10-CM | POA: Diagnosis not present

## 2022-02-14 DIAGNOSIS — C8519 Unspecified B-cell lymphoma, extranodal and solid organ sites: Secondary | ICD-10-CM | POA: Diagnosis not present

## 2022-02-14 DIAGNOSIS — E611 Iron deficiency: Secondary | ICD-10-CM | POA: Diagnosis not present

## 2022-02-14 DIAGNOSIS — C8514 Unspecified B-cell lymphoma, lymph nodes of axilla and upper limb: Secondary | ICD-10-CM | POA: Diagnosis not present

## 2022-07-07 DIAGNOSIS — L820 Inflamed seborrheic keratosis: Secondary | ICD-10-CM | POA: Diagnosis not present

## 2022-07-07 DIAGNOSIS — L821 Other seborrheic keratosis: Secondary | ICD-10-CM | POA: Diagnosis not present

## 2022-07-07 DIAGNOSIS — Z85828 Personal history of other malignant neoplasm of skin: Secondary | ICD-10-CM | POA: Diagnosis not present

## 2022-08-31 DIAGNOSIS — E669 Obesity, unspecified: Secondary | ICD-10-CM | POA: Diagnosis not present

## 2022-08-31 DIAGNOSIS — Z1389 Encounter for screening for other disorder: Secondary | ICD-10-CM | POA: Diagnosis not present

## 2022-08-31 DIAGNOSIS — Z1211 Encounter for screening for malignant neoplasm of colon: Secondary | ICD-10-CM | POA: Diagnosis not present

## 2022-08-31 DIAGNOSIS — Z0000 Encounter for general adult medical examination without abnormal findings: Secondary | ICD-10-CM | POA: Diagnosis not present

## 2022-09-12 DIAGNOSIS — E669 Obesity, unspecified: Secondary | ICD-10-CM | POA: Diagnosis not present

## 2022-09-12 DIAGNOSIS — Z1211 Encounter for screening for malignant neoplasm of colon: Secondary | ICD-10-CM | POA: Diagnosis not present

## 2022-09-12 DIAGNOSIS — F419 Anxiety disorder, unspecified: Secondary | ICD-10-CM | POA: Diagnosis not present

## 2022-09-12 DIAGNOSIS — R7303 Prediabetes: Secondary | ICD-10-CM | POA: Diagnosis not present

## 2022-09-12 DIAGNOSIS — M8580 Other specified disorders of bone density and structure, unspecified site: Secondary | ICD-10-CM | POA: Diagnosis not present

## 2022-09-12 DIAGNOSIS — Z6831 Body mass index (BMI) 31.0-31.9, adult: Secondary | ICD-10-CM | POA: Diagnosis not present

## 2022-09-12 DIAGNOSIS — E782 Mixed hyperlipidemia: Secondary | ICD-10-CM | POA: Diagnosis not present

## 2022-09-12 DIAGNOSIS — C8589 Other specified types of non-Hodgkin lymphoma, extranodal and solid organ sites: Secondary | ICD-10-CM | POA: Diagnosis not present

## 2022-09-14 DIAGNOSIS — R92333 Mammographic heterogeneous density, bilateral breasts: Secondary | ICD-10-CM | POA: Diagnosis not present

## 2022-09-14 DIAGNOSIS — Z1231 Encounter for screening mammogram for malignant neoplasm of breast: Secondary | ICD-10-CM | POA: Diagnosis not present

## 2023-02-13 DIAGNOSIS — D508 Other iron deficiency anemias: Secondary | ICD-10-CM | POA: Diagnosis not present

## 2023-02-13 DIAGNOSIS — C8519 Unspecified B-cell lymphoma, extranodal and solid organ sites: Secondary | ICD-10-CM | POA: Diagnosis not present

## 2023-02-14 DIAGNOSIS — I10 Essential (primary) hypertension: Secondary | ICD-10-CM | POA: Diagnosis not present

## 2023-02-14 DIAGNOSIS — Z6832 Body mass index (BMI) 32.0-32.9, adult: Secondary | ICD-10-CM | POA: Diagnosis not present

## 2023-03-12 DIAGNOSIS — J029 Acute pharyngitis, unspecified: Secondary | ICD-10-CM | POA: Diagnosis not present

## 2023-03-15 DIAGNOSIS — Z6832 Body mass index (BMI) 32.0-32.9, adult: Secondary | ICD-10-CM | POA: Diagnosis not present

## 2023-03-15 DIAGNOSIS — I10 Essential (primary) hypertension: Secondary | ICD-10-CM | POA: Diagnosis not present

## 2023-07-06 DIAGNOSIS — Z85828 Personal history of other malignant neoplasm of skin: Secondary | ICD-10-CM | POA: Diagnosis not present

## 2023-07-06 DIAGNOSIS — L821 Other seborrheic keratosis: Secondary | ICD-10-CM | POA: Diagnosis not present

## 2023-09-09 DIAGNOSIS — E782 Mixed hyperlipidemia: Secondary | ICD-10-CM | POA: Diagnosis not present

## 2023-09-09 DIAGNOSIS — I10 Essential (primary) hypertension: Secondary | ICD-10-CM | POA: Diagnosis not present

## 2023-09-09 DIAGNOSIS — M1612 Unilateral primary osteoarthritis, left hip: Secondary | ICD-10-CM | POA: Diagnosis not present

## 2023-09-09 DIAGNOSIS — E669 Obesity, unspecified: Secondary | ICD-10-CM | POA: Diagnosis not present

## 2023-09-20 DIAGNOSIS — E782 Mixed hyperlipidemia: Secondary | ICD-10-CM | POA: Diagnosis not present

## 2023-09-20 DIAGNOSIS — G4700 Insomnia, unspecified: Secondary | ICD-10-CM | POA: Diagnosis not present

## 2023-09-20 DIAGNOSIS — F419 Anxiety disorder, unspecified: Secondary | ICD-10-CM | POA: Diagnosis not present

## 2023-09-20 DIAGNOSIS — Z1331 Encounter for screening for depression: Secondary | ICD-10-CM | POA: Diagnosis not present

## 2023-09-20 DIAGNOSIS — E781 Pure hyperglyceridemia: Secondary | ICD-10-CM | POA: Diagnosis not present

## 2023-09-20 DIAGNOSIS — I10 Essential (primary) hypertension: Secondary | ICD-10-CM | POA: Diagnosis not present

## 2023-09-20 DIAGNOSIS — E669 Obesity, unspecified: Secondary | ICD-10-CM | POA: Diagnosis not present

## 2023-09-20 DIAGNOSIS — R7303 Prediabetes: Secondary | ICD-10-CM | POA: Diagnosis not present

## 2023-09-20 DIAGNOSIS — Z8572 Personal history of non-Hodgkin lymphomas: Secondary | ICD-10-CM | POA: Diagnosis not present

## 2023-09-20 DIAGNOSIS — Z0000 Encounter for general adult medical examination without abnormal findings: Secondary | ICD-10-CM | POA: Diagnosis not present

## 2023-09-20 DIAGNOSIS — Z6832 Body mass index (BMI) 32.0-32.9, adult: Secondary | ICD-10-CM | POA: Diagnosis not present

## 2023-09-20 DIAGNOSIS — M8580 Other specified disorders of bone density and structure, unspecified site: Secondary | ICD-10-CM | POA: Diagnosis not present

## 2023-09-22 DIAGNOSIS — Z1211 Encounter for screening for malignant neoplasm of colon: Secondary | ICD-10-CM | POA: Diagnosis not present

## 2023-10-02 DIAGNOSIS — N289 Disorder of kidney and ureter, unspecified: Secondary | ICD-10-CM | POA: Diagnosis not present

## 2023-10-08 DIAGNOSIS — I10 Essential (primary) hypertension: Secondary | ICD-10-CM | POA: Diagnosis not present

## 2023-10-09 DIAGNOSIS — M1612 Unilateral primary osteoarthritis, left hip: Secondary | ICD-10-CM | POA: Diagnosis not present

## 2023-10-09 DIAGNOSIS — E782 Mixed hyperlipidemia: Secondary | ICD-10-CM | POA: Diagnosis not present

## 2023-10-09 DIAGNOSIS — I10 Essential (primary) hypertension: Secondary | ICD-10-CM | POA: Diagnosis not present

## 2023-10-09 DIAGNOSIS — E669 Obesity, unspecified: Secondary | ICD-10-CM | POA: Diagnosis not present

## 2023-11-06 DIAGNOSIS — R92323 Mammographic fibroglandular density, bilateral breasts: Secondary | ICD-10-CM | POA: Diagnosis not present

## 2023-11-06 DIAGNOSIS — Z1231 Encounter for screening mammogram for malignant neoplasm of breast: Secondary | ICD-10-CM | POA: Diagnosis not present

## 2023-11-07 DIAGNOSIS — I10 Essential (primary) hypertension: Secondary | ICD-10-CM | POA: Diagnosis not present

## 2023-11-09 DIAGNOSIS — E669 Obesity, unspecified: Secondary | ICD-10-CM | POA: Diagnosis not present

## 2023-11-09 DIAGNOSIS — I10 Essential (primary) hypertension: Secondary | ICD-10-CM | POA: Diagnosis not present

## 2023-11-09 DIAGNOSIS — E782 Mixed hyperlipidemia: Secondary | ICD-10-CM | POA: Diagnosis not present

## 2023-11-09 DIAGNOSIS — M1612 Unilateral primary osteoarthritis, left hip: Secondary | ICD-10-CM | POA: Diagnosis not present

## 2023-11-10 DIAGNOSIS — N2889 Other specified disorders of kidney and ureter: Secondary | ICD-10-CM | POA: Diagnosis not present

## 2023-11-10 DIAGNOSIS — N289 Disorder of kidney and ureter, unspecified: Secondary | ICD-10-CM | POA: Diagnosis not present

## 2023-12-07 DIAGNOSIS — I10 Essential (primary) hypertension: Secondary | ICD-10-CM | POA: Diagnosis not present

## 2023-12-10 DIAGNOSIS — E782 Mixed hyperlipidemia: Secondary | ICD-10-CM | POA: Diagnosis not present

## 2023-12-10 DIAGNOSIS — E669 Obesity, unspecified: Secondary | ICD-10-CM | POA: Diagnosis not present

## 2023-12-10 DIAGNOSIS — I10 Essential (primary) hypertension: Secondary | ICD-10-CM | POA: Diagnosis not present

## 2023-12-10 DIAGNOSIS — M1612 Unilateral primary osteoarthritis, left hip: Secondary | ICD-10-CM | POA: Diagnosis not present

## 2024-01-06 DIAGNOSIS — I10 Essential (primary) hypertension: Secondary | ICD-10-CM | POA: Diagnosis not present

## 2024-01-09 DIAGNOSIS — I10 Essential (primary) hypertension: Secondary | ICD-10-CM | POA: Diagnosis not present

## 2024-01-09 DIAGNOSIS — E669 Obesity, unspecified: Secondary | ICD-10-CM | POA: Diagnosis not present

## 2024-01-09 DIAGNOSIS — E782 Mixed hyperlipidemia: Secondary | ICD-10-CM | POA: Diagnosis not present

## 2024-01-09 DIAGNOSIS — M1612 Unilateral primary osteoarthritis, left hip: Secondary | ICD-10-CM | POA: Diagnosis not present

## 2024-02-05 DIAGNOSIS — I10 Essential (primary) hypertension: Secondary | ICD-10-CM | POA: Diagnosis not present

## 2024-02-09 DIAGNOSIS — E669 Obesity, unspecified: Secondary | ICD-10-CM | POA: Diagnosis not present

## 2024-02-09 DIAGNOSIS — E782 Mixed hyperlipidemia: Secondary | ICD-10-CM | POA: Diagnosis not present

## 2024-02-09 DIAGNOSIS — M1612 Unilateral primary osteoarthritis, left hip: Secondary | ICD-10-CM | POA: Diagnosis not present

## 2024-02-09 DIAGNOSIS — I10 Essential (primary) hypertension: Secondary | ICD-10-CM | POA: Diagnosis not present

## 2024-02-12 DIAGNOSIS — C8519 Unspecified B-cell lymphoma, extranodal and solid organ sites: Secondary | ICD-10-CM | POA: Diagnosis not present

## 2024-02-12 DIAGNOSIS — D508 Other iron deficiency anemias: Secondary | ICD-10-CM | POA: Diagnosis not present

## 2024-03-06 DIAGNOSIS — I10 Essential (primary) hypertension: Secondary | ICD-10-CM | POA: Diagnosis not present

## 2024-03-10 DIAGNOSIS — I10 Essential (primary) hypertension: Secondary | ICD-10-CM | POA: Diagnosis not present

## 2024-03-10 DIAGNOSIS — E782 Mixed hyperlipidemia: Secondary | ICD-10-CM | POA: Diagnosis not present

## 2024-03-10 DIAGNOSIS — M1612 Unilateral primary osteoarthritis, left hip: Secondary | ICD-10-CM | POA: Diagnosis not present

## 2024-03-10 DIAGNOSIS — E669 Obesity, unspecified: Secondary | ICD-10-CM | POA: Diagnosis not present
